# Patient Record
Sex: Female | Born: 1950
Health system: Southern US, Community
[De-identification: ages and names within clinical notes are randomized; demographics above are authoritative.]

## PROBLEM LIST (undated history)

## (undated) DIAGNOSIS — K589 Irritable bowel syndrome without diarrhea: Secondary | ICD-10-CM

## (undated) DIAGNOSIS — L509 Urticaria, unspecified: Secondary | ICD-10-CM

## (undated) DIAGNOSIS — M8580 Other specified disorders of bone density and structure, unspecified site: Secondary | ICD-10-CM

## (undated) DIAGNOSIS — F419 Anxiety disorder, unspecified: Secondary | ICD-10-CM

## (undated) DIAGNOSIS — I10 Essential (primary) hypertension: Secondary | ICD-10-CM

## (undated) DIAGNOSIS — T7840XA Allergy, unspecified, initial encounter: Secondary | ICD-10-CM

## (undated) DIAGNOSIS — R011 Cardiac murmur, unspecified: Secondary | ICD-10-CM

## (undated) DIAGNOSIS — H269 Unspecified cataract: Secondary | ICD-10-CM

## (undated) HISTORY — DX: Urticaria, unspecified: L50.9

## (undated) HISTORY — DX: Anxiety disorder, unspecified: F41.9

## (undated) HISTORY — DX: Essential (primary) hypertension: I10

## (undated) HISTORY — DX: Cardiac murmur, unspecified: R01.1

## (undated) HISTORY — PX: BREAST SURGERY: SHX581

## (undated) HISTORY — DX: Unspecified cataract: H26.9

## (undated) HISTORY — DX: Other specified disorders of bone density and structure, unspecified site: M85.80

## (undated) HISTORY — DX: Irritable bowel syndrome, unspecified: K58.9

## (undated) HISTORY — DX: Allergy, unspecified, initial encounter: T78.40XA

---

## 2008-04-07 ENCOUNTER — Emergency Department (HOSPITAL_BASED_OUTPATIENT_CLINIC_OR_DEPARTMENT_OTHER): Admission: EM | Admit: 2008-04-07 | Discharge: 2008-04-07 | Payer: Self-pay | Admitting: Emergency Medicine

## 2011-08-26 ENCOUNTER — Ambulatory Visit (INDEPENDENT_AMBULATORY_CARE_PROVIDER_SITE_OTHER): Payer: BC Managed Care – PPO | Admitting: Internal Medicine

## 2011-08-26 VITALS — BP 176/84 | HR 94 | Temp 98.1°F | Resp 16 | Ht 65.5 in | Wt 159.0 lb

## 2011-08-26 DIAGNOSIS — F419 Anxiety disorder, unspecified: Secondary | ICD-10-CM | POA: Insufficient documentation

## 2011-08-26 DIAGNOSIS — I10 Essential (primary) hypertension: Secondary | ICD-10-CM

## 2011-08-26 DIAGNOSIS — F411 Generalized anxiety disorder: Secondary | ICD-10-CM

## 2011-08-26 DIAGNOSIS — M545 Low back pain: Secondary | ICD-10-CM

## 2011-08-26 DIAGNOSIS — Z043 Encounter for examination and observation following other accident: Secondary | ICD-10-CM

## 2011-08-26 DIAGNOSIS — M549 Dorsalgia, unspecified: Secondary | ICD-10-CM

## 2011-08-26 DIAGNOSIS — M542 Cervicalgia: Secondary | ICD-10-CM

## 2011-08-26 DIAGNOSIS — R21 Rash and other nonspecific skin eruption: Secondary | ICD-10-CM

## 2011-08-26 MED ORDER — CLONAZEPAM 0.5 MG PO TABS: 0.5000 mg | ORAL_TABLET | Freq: Two times a day (BID) | ORAL | Status: DC | PRN

## 2011-08-26 MED ORDER — NEBIVOLOL HCL 5 MG PO TABS: 5.0000 mg | ORAL_TABLET | Freq: Every day | ORAL | Status: DC
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
## 2011-10-27 ENCOUNTER — Ambulatory Visit (INDEPENDENT_AMBULATORY_CARE_PROVIDER_SITE_OTHER): Payer: BC Managed Care – PPO | Admitting: Family Medicine

## 2011-10-27 VITALS — BP 187/98 | HR 91 | Temp 97.9°F | Resp 18 | Wt 153.0 lb

## 2011-10-27 DIAGNOSIS — R42 Dizziness and giddiness: Secondary | ICD-10-CM

## 2011-10-27 DIAGNOSIS — I10 Essential (primary) hypertension: Secondary | ICD-10-CM

## 2011-10-27 MED ORDER — NEBIVOLOL HCL 5 MG PO TABS: ORAL_TABLET | ORAL | Status: DC

## 2011-10-27 MED ORDER — MECLIZINE HCL 50 MG PO TABS: 25.0000 mg | ORAL_TABLET | Freq: Three times a day (TID) | ORAL | Status: AC | PRN

## 2011-10-27 NOTE — Patient Instructions (Signed)
Vertigo Vertigo means you feel like you or your surroundings are moving when they are not. Vertigo can be dangerous if it occurs when you are at work, driving, or performing difficult activities.  CAUSES  Vertigo occurs when there is a conflict of signals sent to your brain from the visual and sensory systems in your body. There are many different causes of vertigo, including:  Infections, especially in the inner ear.   A bad reaction to a drug or misuse of alcohol and medicines.   Withdrawal from drugs or alcohol.   Rapidly changing positions, such as lying down or rolling over in bed.   A migraine headache.   Decreased blood flow to the brain.   Increased pressure in the brain from a head injury, infection, tumor, or bleeding.  SYMPTOMS  You may feel as though the world is spinning around or you are falling to the ground. Because your balance is upset, vertigo can cause nausea and vomiting. You may have involuntary eye movements (nystagmus). DIAGNOSIS  Vertigo is usually diagnosed by physical exam. If the cause of your vertigo is unknown, your caregiver may perform imaging tests, such as an MRI scan (magnetic resonance imaging). TREATMENT  Most cases of vertigo resolve on their own, without treatment. Depending on the cause, your caregiver may prescribe certain medicines. If your vertigo is related to body position issues, your caregiver may recommend movements or procedures to correct the problem. In rare cases, if your vertigo is caused by certain inner ear problems, you may need surgery. HOME CARE INSTRUCTIONS   Follow your caregiver's instructions.   Avoid driving.   Avoid operating heavy machinery.   Avoid performing any tasks that would be dangerous to you or others during a vertigo episode.   Tell your caregiver if you notice that certain medicines seem to be causing your vertigo. Some of the medicines used to treat vertigo episodes can actually make them worse in some  people.  SEEK IMMEDIATE MEDICAL CARE IF:   Your medicines do not relieve your vertigo or are making it worse.   You develop problems with talking, walking, weakness, or using your arms, hands, or legs.   You develop severe headaches.   Your nausea or vomiting continues or gets worse.   You develop visual changes.   A family member notices behavioral changes.   Your condition gets worse.  MAKE SURE YOU:  Understand these instructions.   Will watch your condition.   Will get help right away if you are not doing well or get worse.  Document Released: 04/13/2005 Document Revised: 06/23/2011 Document Reviewed: 01/20/2011 [employer] Patient Information 2012 [employer]. 

## 2011-10-27 NOTE — Progress Notes (Signed)
Subjective: 61 year old lady who has had a little stuffiness the past few days, but not been ill. Has been having it little bit of dizziness off and on, but nothing major. This morning when she awakened she had extreme dizziness. She didn't feel like she can get up she was so dizzy. She had to call for assistance. He was nauseous but she did not vomit. The dizziness was very intense than his paper to a little bit disappointed today. She still feels poorly arousable. Her blood pressure was little bit elevated at home, having been asked excellent yesterday. They checked her blood sugar which was good today. She has been a little bit Borderline on that. She has not had major episodes of  dizziness like this in the past. No headache. No fever.  Objective: Alert oriented lady who looks like she does not feel well. TMs are normal. Eyes PERRLA. Fundi benign. No nystatin is. Throat clear. Neck supple without nodes or thyromegaly. No carotid bruits. Chest clear to auscultation. Heart regular without murmurs. No arrhythmias were noted. Neck was supple.  Assessment: Dizziness, nonspecific vertigo. Hypertension(180/106 when I checked it)  Plan: Antivert Increase blood pressure medication to 10 mg daily. Go home and rest. Do not drive. It is getting worse she needs to return for further assessment of go to the emergency room after hours.

## 2011-10-28 ENCOUNTER — Telehealth: Payer: Self-pay

## 2011-10-28 NOTE — Telephone Encounter (Signed)
Pt is calling stating that pt is in a lot of pain and has a lot of congestion and has taken the medication dr Quintella Reichert has precribed and it is not working she feels worse   Please call her at [phone]  Pt states she is not coming back in to pay again

## 2011-10-28 NOTE — Telephone Encounter (Signed)
Pts husband is calling again in reference to this pt he states that he believes that she has a sinus infection because she used a netti pot and felt better, also he states that pt is still having problems with vertigo. Please Advise! [phone]

## 2011-10-28 NOTE — Telephone Encounter (Signed)
Pt is still experiencing dizziness and would like to speak with a nurse or Dr Merla Riches, pt was seen in office 10-27-11 please contact pt has some concern her husband contacted [facility]. [phone]

## 2011-10-29 NOTE — Telephone Encounter (Signed)
.  [facility] Patient's husband called again in reference to patient still having congestion and sinus problems.  Patient would like return call from MD regarding this.  Please call patient at [phone].

## 2011-10-30 ENCOUNTER — Ambulatory Visit (INDEPENDENT_AMBULATORY_CARE_PROVIDER_SITE_OTHER): Payer: BC Managed Care – PPO | Admitting: Internal Medicine

## 2011-10-30 VITALS — BP 178/80 | HR 102 | Temp 98.4°F | Resp 16 | Ht 65.5 in | Wt 152.0 lb

## 2011-10-30 DIAGNOSIS — R42 Dizziness and giddiness: Secondary | ICD-10-CM

## 2011-10-30 DIAGNOSIS — J019 Acute sinusitis, unspecified: Secondary | ICD-10-CM

## 2011-10-30 DIAGNOSIS — J309 Allergic rhinitis, unspecified: Secondary | ICD-10-CM

## 2011-10-30 MED ORDER — PREDNISONE 20 MG PO TABS: ORAL_TABLET | ORAL | Status: DC

## 2011-10-30 MED ORDER — CETIRIZINE HCL 10 MG PO TABS: 10.0000 mg | ORAL_TABLET | Freq: Every day | ORAL | Status: DC

## 2011-10-30 MED ORDER — FLUTICASONE PROPIONATE 50 MCG/ACT NA SUSP: 2.0000 | Freq: Every day | NASAL | Status: DC

## 2011-10-30 MED ORDER — AMOXICILLIN 500 MG PO CAPS: 1000.0000 mg | ORAL_CAPSULE | Freq: Two times a day (BID) | ORAL | Status: AC

## 2011-10-30 NOTE — Progress Notes (Signed)
  Subjective:    Patient ID: Amanda Swanson, female    DOB: April 11, 1951, 61 y.o.   MRN: [id number]  HPIsee Recent evaluation for dizziness Continues to feel postural dizziness especially sitting to standing Continues with marked nasal congestion and sinus pressure with frontal headaches Has some purulent discharge in the morning History of spring allergies in the past No cough No fever No syncope History of hypertension control with bystolic/3 years ago she had an difficult time controlling his blood pressure after steroid injection in her shoulder and went through several medications/her current blood pressure readings at home are normal/she often has abnormal readings in a physician's office     Review of SystemsNo weight loss or night sweats Anxiety stable on medications when necessary No palpitations or chest pain/no dyspnea on exertion/no edema     Objective:   Physical ExamVital signs stable except blood pressure 178/80 Obviously uncomfortable with changing positions quickly Pupils equal round and reactive to light/EOMs conjugate/no Nystagmus TMs clear Nares boggy with purulent discharge Bilateral maxillary sinus tenderness to percussion Throat clear without a.c. Nodes Lungs clear Heart regular without murmurs rubs and gallops Neurological: Cranial nerves II through XII intact Finger to nose intact/Romberg negative/5 motor intact Gait intact       Assessment & Plan:  Problem #1 vertigo/dizziness probably secondary to #2 Problem #2 sinusitis secondary #3 Problem #3 seasonal allergic rhinitis  Plan prednisone/amoxicillin /Zyrtec/and Flonase Followup in one week if not well Continue Flonase for 3 months CPE scheduled for May

## 2011-10-31 NOTE — Telephone Encounter (Signed)
LMOM to CB. 

## 2011-11-05 NOTE — Telephone Encounter (Signed)
Patient was re-evaluated for this problem on 10/30/2011 by Dr. Merla Riches.  Issue addressed.

## 2011-11-23 ENCOUNTER — Encounter: Payer: BC Managed Care – PPO | Admitting: Internal Medicine

## 2011-11-30 ENCOUNTER — Encounter: Payer: BC Managed Care – PPO | Admitting: Internal Medicine

## 2012-01-11 ENCOUNTER — Encounter: Payer: Self-pay | Admitting: Internal Medicine

## 2012-01-11 ENCOUNTER — Ambulatory Visit (INDEPENDENT_AMBULATORY_CARE_PROVIDER_SITE_OTHER): Payer: BC Managed Care – PPO | Admitting: Internal Medicine

## 2012-01-11 VITALS — BP 177/100 | HR 93 | Temp 98.3°F | Resp 17 | Ht 66.5 in | Wt 153.0 lb

## 2012-01-11 DIAGNOSIS — Z0000 Encounter for general adult medical examination without abnormal findings: Secondary | ICD-10-CM

## 2012-01-11 DIAGNOSIS — I10 Essential (primary) hypertension: Secondary | ICD-10-CM

## 2012-01-11 DIAGNOSIS — H1045 Other chronic allergic conjunctivitis: Secondary | ICD-10-CM

## 2012-01-11 DIAGNOSIS — H1010 Acute atopic conjunctivitis, unspecified eye: Secondary | ICD-10-CM

## 2012-01-11 DIAGNOSIS — R5381 Other malaise: Secondary | ICD-10-CM

## 2012-01-11 DIAGNOSIS — G4700 Insomnia, unspecified: Secondary | ICD-10-CM

## 2012-01-11 DIAGNOSIS — R5383 Other fatigue: Secondary | ICD-10-CM

## 2012-01-11 LAB — LIPID PANEL
HDL: 63 mg/dL (ref >39)
LDL Cholesterol: 127 mg/dL — ABNORMAL HIGH (ref 0–99)
Total CHOL/HDL Ratio: 3.3 Ratio
Triglycerides: 106 mg/dL (ref <150)
VLDL: 21 mg/dL (ref 0–40)

## 2012-01-11 LAB — COMPREHENSIVE METABOLIC PANEL
AST: 18 U/L (ref 0–37)
Alkaline Phosphatase: 77 U/L (ref 39–117)
BUN: 13 mg/dL (ref 6–23)
Creat: 0.78 mg/dL (ref 0.50–1.10)
Glucose, Bld: 108 mg/dL — ABNORMAL HIGH (ref 70–99)
Potassium: 3.9 mEq/L (ref 3.5–5.3)
Total Bilirubin: 0.6 mg/dL (ref 0.3–1.2)

## 2012-01-11 LAB — CBC WITH DIFFERENTIAL/PLATELET
Basophils Absolute: 0 10*3/uL (ref 0.0–0.1)
Basophils Relative: 0 % (ref 0–1)
Eosinophils Absolute: 0.1 10*3/uL (ref 0.0–0.7)
Eosinophils Relative: 1 % (ref 0–5)
HCT: 42.8 % (ref 36.0–46.0)
MCH: 27.9 pg (ref 26.0–34.0)
MCHC: 34.8 g/dL (ref 30.0–36.0)
Monocytes Absolute: 0.5 10*3/uL (ref 0.1–1.0)
Neutro Abs: 5.5 10*3/uL (ref 1.7–7.7)
RDW: 14.5 % (ref 11.5–15.5)

## 2012-01-11 MED ORDER — NEBIVOLOL HCL 10 MG PO TABS: 10.0000 mg | ORAL_TABLET | Freq: Every day | ORAL | Status: DC

## 2012-01-11 MED ORDER — OLOPATADINE HCL 0.2 % OP SOLN: 1.0000 [drp] | Freq: Every day | OPHTHALMIC | Status: DC

## 2012-01-11 MED ORDER — NEBIVOLOL HCL 5 MG PO TABS: 10.0000 mg | ORAL_TABLET | Freq: Every day | ORAL | Status: DC

## 2012-01-11 NOTE — Progress Notes (Signed)
  Subjective:    Patient ID: Amanda Swanson, female    DOB: 1951/03/18, 61 y.o.   MRN: [id number]  HPIannual exam Patient Active Problem List  Diagnosis  . Anxiety-centered around driving/riding//used to commute long way tot each///just retired/to [employer] for ann[employer] this wekend--?better  . HTN (hypertension)-bysto 5    Past medical history, surgical history, family history, social history, and preventive history intact and scanned//Important for history of breast cancer in one sister and ovarian cancer and another. Also diabetes in sister and brother. Recent Pap and mammogram normal/colonoscopy 2 months ago within normal limits   Review of Systems14 system scanned/positive for early morning eye redness with itching for 3 months, intermittent insomnia for years due to worrying and partner snoring, recent fatigue without weight loss or weight gain and without activity change or night sweats.     Objective:   Physical Exam Vital signs blood pressure 177/100 Mild overweight Skin clear HEENT intact except bilateral mild conjunctival injection Neck supple without thyromegaly or lymphadenopathy Lungs clear Heart regular without murmurs rubs or gallops Abdomen nontender with no organomegaly or masses Major joints with full range of motion and no swelling Neurological intact Psychiatric intact       Assessment & Plan:   1. Annual physical exam    2. HTN (hypertension) -Increased medication Increase exercise/Continue home monitoring nebivolol (BYSTOLIC) 10 MG tablet, CBC with Differential, Comprehensive metabolic panel, Lipid panel, TSH, Vitamin D 25 hydroxy, DISCONTINUED: nebivolol (BYSTOLIC) 5 MG tablet  3. Allergic conjunctivitis -add drops once a day Olopatadine HCl 0.2 % SOLN,  4. Insomnia  Occasional Klonopin/she will call if she needs another prescription/she takes this rarely  5. Fatigue  Will notify of plan after labs

## 2012-01-12 ENCOUNTER — Encounter: Payer: Self-pay | Admitting: Internal Medicine

## 2012-01-12 LAB — TSH: TSH: 1.212 u[IU]/mL (ref 0.350–4.500)

## 2012-02-10 ENCOUNTER — Ambulatory Visit (INDEPENDENT_AMBULATORY_CARE_PROVIDER_SITE_OTHER): Payer: BC Managed Care – PPO | Admitting: Internal Medicine

## 2012-02-10 ENCOUNTER — Telehealth: Payer: Self-pay

## 2012-02-10 VITALS — BP 152/92 | HR 87 | Temp 97.7°F | Resp 17 | Ht 67.0 in | Wt 154.0 lb

## 2012-02-10 DIAGNOSIS — J329 Chronic sinusitis, unspecified: Secondary | ICD-10-CM

## 2012-02-10 DIAGNOSIS — J309 Allergic rhinitis, unspecified: Secondary | ICD-10-CM

## 2012-02-10 DIAGNOSIS — H1010 Acute atopic conjunctivitis, unspecified eye: Secondary | ICD-10-CM

## 2012-02-10 DIAGNOSIS — M26629 Arthralgia of temporomandibular joint, unspecified side: Secondary | ICD-10-CM

## 2012-02-10 DIAGNOSIS — E785 Hyperlipidemia, unspecified: Secondary | ICD-10-CM | POA: Insufficient documentation

## 2012-02-10 DIAGNOSIS — I10 Essential (primary) hypertension: Secondary | ICD-10-CM

## 2012-02-10 DIAGNOSIS — R42 Dizziness and giddiness: Secondary | ICD-10-CM

## 2012-02-10 MED ORDER — CYCLOBENZAPRINE HCL 10 MG PO TABS: 10.0000 mg | ORAL_TABLET | Freq: Every day | ORAL | Status: AC

## 2012-02-10 MED ORDER — AMOXICILLIN 500 MG PO CAPS: 1000.0000 mg | ORAL_CAPSULE | Freq: Two times a day (BID) | ORAL | Status: AC

## 2012-02-10 MED ORDER — HYDROCHLOROTHIAZIDE 12.5 MG PO CAPS: 12.5000 mg | ORAL_CAPSULE | Freq: Every day | ORAL | Status: DC
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
## 2012-02-10 NOTE — Telephone Encounter (Signed)
Pt is having problems with sinus and allergy and is hoping someone can prescribe something for her over the phone.  Best # [phone]  [id number] [place], [place] - [address]

## 2012-02-11 NOTE — Telephone Encounter (Signed)
Pt seen yesterday for problem

## 2012-02-25 ENCOUNTER — Ambulatory Visit (INDEPENDENT_AMBULATORY_CARE_PROVIDER_SITE_OTHER): Payer: BC Managed Care – PPO | Admitting: Family Medicine

## 2012-02-25 VITALS — BP 159/79 | HR 101 | Temp 98.1°F | Resp 18 | Ht 65.5 in | Wt 155.8 lb

## 2012-02-25 DIAGNOSIS — N750 Cyst of Bartholin's gland: Secondary | ICD-10-CM

## 2012-02-25 DIAGNOSIS — N760 Acute vaginitis: Secondary | ICD-10-CM

## 2012-02-25 DIAGNOSIS — N751 Abscess of Bartholin's gland: Secondary | ICD-10-CM

## 2012-02-25 DIAGNOSIS — N898 Other specified noninflammatory disorders of vagina: Secondary | ICD-10-CM

## 2012-02-25 LAB — POCT WET PREP WITH KOH
Clue Cells Wet Prep HPF POC: NEGATIVE
KOH Prep POC: NEGATIVE
RBC Wet Prep HPF POC: NEGATIVE
Trichomonas, UA: NEGATIVE
Yeast Wet Prep HPF POC: NEGATIVE

## 2012-02-25 MED ORDER — HYDROCODONE-ACETAMINOPHEN 5-500 MG PO TABS: 1.0000 | ORAL_TABLET | Freq: Three times a day (TID) | ORAL | Status: AC | PRN

## 2012-02-25 MED ORDER — METRONIDAZOLE 250 MG PO TABS: 250.0000 mg | ORAL_TABLET | Freq: Three times a day (TID) | ORAL | Status: AC

## 2012-02-25 MED ORDER — CLINDAMYCIN HCL 150 MG PO CAPS: 150.0000 mg | ORAL_CAPSULE | Freq: Three times a day (TID) | ORAL | Status: AC

## 2012-02-25 MED ORDER — FLUCONAZOLE 150 MG PO TABS: 150.0000 mg | ORAL_TABLET | Freq: Once | ORAL | Status: AC

## 2012-02-25 NOTE — Addendum Note (Signed)
Addended by: Elvina Sidle on: 02/25/2012 11:28 AM   Modules accepted: Orders

## 2012-02-25 NOTE — Progress Notes (Addendum)
61 year old Hispanic([place]) woman is here today with complaints of Vaginal itch and cyst. Pt states that she was prescribed an antibiotic for a sinus infection and feels that's where the yeast infection is coming from. Pt also states that she a bump in her vagina area. Pt states she has a history of cyst that has to be lanced. Pt states she has no other complaints.  Objective: Mild distress when sitting  Pelvic exam reveals a 3-4 cm right Bartholin's abscess which is tender and fluctuant  After informed consent, the abscess was anesthetized with 1% Xylocaine with epinephrine An 18-gauge needle was inserted into the abscess and 20 cc of yellow pus was aspirated. Following this the cyst was massaged and another 20 cc was expressed. 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 Abscess of Bartholin's gland  clindamycin (CLEOCIN) 150 MG capsule, metroNIDAZOLE (FLAGYL) 250 MG tablet, fluconazole (DIFLUCAN) 150 MG tablet, HYDROcodone-acetaminophen (VICODIN) 5-500 MG per tablet  2. Vaginal itching  POCT Wet Prep with KOH   Patient told to do sitz baths and follow up gyn referral will be made to Dr Juliene Pina

## 2012-02-25 NOTE — Addendum Note (Signed)
Addended by: Bronson Curb on: 02/25/2012 11:32 AM   Modules accepted: Orders

## 2012-02-29 LAB — WOUND CULTURE
Gram Stain: NONE SEEN
Gram Stain: NONE SEEN

## 2012-03-01 ENCOUNTER — Telehealth: Payer: Self-pay

## 2012-03-01 NOTE — Telephone Encounter (Signed)
Faxed over records to [facility] per request below.

## 2012-03-01 NOTE — Telephone Encounter (Signed)
[facility] is sending authorization for request of records from 02/25/12.  Please fax to [phone] once release of information form received from fax.

## 2012-03-02 ENCOUNTER — Telehealth: Payer: Self-pay

## 2012-03-02 MED ORDER — CIPROFLOXACIN HCL 500 MG PO TABS: 500.0000 mg | ORAL_TABLET | Freq: Two times a day (BID) | ORAL | Status: AC

## 2012-03-02 NOTE — Telephone Encounter (Signed)
Pt had culture done and wants results, she is taking Clindamycin 150 mg and Flagyl 250 mg

## 2012-03-02 NOTE — Telephone Encounter (Signed)
Growing Citrobacter. Please change to Cipro.

## 2012-03-02 NOTE — Telephone Encounter (Signed)
I have advised patient 

## 2012-10-01 ENCOUNTER — Telehealth: Payer: Self-pay

## 2012-10-01 DIAGNOSIS — J309 Allergic rhinitis, unspecified: Secondary | ICD-10-CM

## 2012-10-01 NOTE — Telephone Encounter (Signed)
Pt would like referral to allergist if possible [phone]

## 2012-10-01 NOTE — Telephone Encounter (Signed)
Is it okay to refer?

## 2012-10-01 NOTE — Telephone Encounter (Signed)
I put in a referral to allergy

## 2012-10-02 NOTE — Telephone Encounter (Signed)
Advised patient of referral made/ Amy

## 2013-04-03 ENCOUNTER — Encounter: Payer: Self-pay | Admitting: Internal Medicine

## 2013-04-03 ENCOUNTER — Ambulatory Visit (INDEPENDENT_AMBULATORY_CARE_PROVIDER_SITE_OTHER): Payer: BC Managed Care – PPO | Admitting: Internal Medicine

## 2013-04-03 VITALS — BP 140/86 | HR 83 | Temp 98.6°F | Resp 16 | Ht 66.0 in | Wt 158.0 lb

## 2013-04-03 DIAGNOSIS — F411 Generalized anxiety disorder: Secondary | ICD-10-CM

## 2013-04-03 DIAGNOSIS — F419 Anxiety disorder, unspecified: Secondary | ICD-10-CM

## 2013-04-03 DIAGNOSIS — I10 Essential (primary) hypertension: Secondary | ICD-10-CM

## 2013-04-03 DIAGNOSIS — M7581 Other shoulder lesions, right shoulder: Secondary | ICD-10-CM

## 2013-04-03 DIAGNOSIS — R002 Palpitations: Secondary | ICD-10-CM

## 2013-04-03 DIAGNOSIS — Z0000 Encounter for general adult medical examination without abnormal findings: Secondary | ICD-10-CM

## 2013-04-03 DIAGNOSIS — M67919 Unspecified disorder of synovium and tendon, unspecified shoulder: Secondary | ICD-10-CM

## 2013-04-03 LAB — CBC WITH DIFFERENTIAL/PLATELET
Basophils Absolute: 0 10*3/uL (ref 0.0–0.1)
Eosinophils Relative: 2 % (ref 0–5)
HCT: 42.3 % (ref 36.0–46.0)
Hemoglobin: 14.4 g/dL (ref 12.0–15.0)
Lymphocytes Relative: 24 % (ref 12–46)
MCHC: 34 g/dL (ref 30.0–36.0)
MCV: 80.6 fL (ref 78.0–100.0)
Monocytes Absolute: 0.7 10*3/uL (ref 0.1–1.0)
Monocytes Relative: 9 % (ref 3–12)
Neutro Abs: 5 10*3/uL (ref 1.7–7.7)
RDW: 15.2 % (ref 11.5–15.5)
WBC: 7.7 10*3/uL (ref 4.0–10.5)

## 2013-04-03 MED ORDER — NEBIVOLOL HCL 10 MG PO TABS: 5.0000 mg | ORAL_TABLET | Freq: Every day | ORAL | Status: DC

## 2013-04-03 MED ORDER — CLONAZEPAM 0.5 MG PO TABS: 0.5000 mg | ORAL_TABLET | Freq: Two times a day (BID) | ORAL | Status: DC | PRN

## 2013-04-03 NOTE — Progress Notes (Signed)
  Subjective:    Patient ID: Amanda Swanson, female    DOB: 1950-08-14, 62 y.o.   MRN: [id number]  HPI    Review of Systems  Constitutional: Negative.   HENT: Positive for neck stiffness and sinus pressure.   Eyes: Positive for itching.  Respiratory: Negative.   Cardiovascular: Negative.   Gastrointestinal: Negative.   Endocrine: Negative.   Genitourinary: Negative.   Skin: Negative.   Allergic/Immunologic: Negative.   Neurological: Negative.   Hematological: Negative.   Psychiatric/Behavioral: Positive for sleep disturbance.       Objective:   Physical Exam        Assessment & Plan:

## 2013-04-03 NOTE — Progress Notes (Signed)
Subjective:    Patient ID: Amanda Swanson, female    DOB: 1951/06/27, 62 y.o.   MRN: [id number]  HPIannual Patient Active Problem List   Diagnosis Date Noted  . Palpitations 04/03/2013  . hyperlipidemia 02/10/2012  . Anxiety 08/26/2011  . HTN (hypertension) 08/26/2011  Current outpatient prescriptions:clonazePAM (KLONOPIN) 0.5 MG tablet, Take 1 tablet (0.5 mg total) by mouth 2 (two) times daily as needed for anxiety. Takes 1/2 pill prnrarely  Multiple Vitamin (MULTIVITAMIN) tablet, Take by mouth daily. Takes 1/2 daily cetirizine (ZYRTEC) 10 MG tablet,  fluticasone (FLONASE) 50 MCG/ACT nasal spray, Place 2 sprays into the nose daily.,  Recent allerg eval=allergic to everything/antihist not effective hydrochlorothiazide (MICROZIDE) 12.5 MG capsule, Take 1 capsule (12.5 mg total) by mouth daily-she choses to use seldom/prn with home BP nebivolol (BYSTOLIC) 10 MG tablet, Take 0.5 tablets (5 mg total) by mouth daily.long term for bp and contr of palpitat Olopatadine HCl 0.2 % SOLN, Apply 1 drop to eye daily., Disp: 1 Bottle, Rfl: 11   BP variable palpit hx--bystolic for both//has diuretic-rarely uses Retired st empl--lots of volunteer work Married happy  Review of Systems See [employer] Also c/o pain R shoulder 3 mos-after lifting Also c/o bloating aft meals freq w/out pain or diarrhea    Her sleep issues involve waking to pee and not being able to resume sleep Objective:   Physical Exam  Constitutional: She is oriented to person, place, and time. She appears well-developed and well-nourished.  HENT:  Head: Normocephalic.  Right Ear: External ear normal.  Left Ear: External ear normal.  Nose: Nose normal.  Mouth/Throat: Oropharynx is clear and moist. No oropharyngeal exudate.  Eyes: Conjunctivae and EOM are normal. Pupils are equal, round, and reactive to light. Left eye exhibits no discharge.  Neck: Normal range of motion. Neck supple. No thyromegaly present.  Neck rom sl  decr by stiffness but no rad sxt  Cardiovascular: Normal rate, regular rhythm, normal heart sounds and intact distal pulses.   No murmur heard. Pulmonary/Chest: Effort normal and breath sounds normal.  Abdominal: Soft. Bowel sounds are normal. She exhibits no mass. There is no tenderness.  Musculoskeletal: She exhibits no edema.  Right shoulder has pain with abduction against resistance and abduction above 45 Tender over the shoulder generally but not the trapezius or scapular border This does not seem to be radicular from the neck  Lymphadenopathy:    She has no cervical adenopathy.  Neurological: She is alert and oriented to person, place, and time. She has normal reflexes. No cranial nerve deficit. Coordination normal.  Skin: Skin is warm and dry. No rash noted.  Psychiatric: She has a normal mood and affect. Her behavior is normal. Judgment and thought content normal.   BP 140/86  Pulse 83  Temp(Src) 98.6 F (37 C) (Oral)  Resp 16  Ht 5\' 6"  (1.676 m)  Wt 158 lb (71.668 kg)  BMI 25.51 kg/m2  SpO2 98%      Assessment & Plan:  Anxiety - Plan: clonazePAM (KLONOPIN) 0.5 MG tablet  HTN (hypertension) - Plan: nebivolol (BYSTOLIC) 10 MG tablet  Palpitations  Annual physical exam - Plan: CBC with Differential, Comprehensive metabolic panel, Lipid panel, POCT glycosylated hemoglobin (Hb A1C)  Rotator cuff injury--she refuses physical therapy and referral at this point/gave handout for home exercises to begin twice a day   Meds ordered this encounter  Medications  . clonazePAM (KLONOPIN) 0.5 MG tablet    Sig: Take 1 tablet (0.5 mg total) by  mouth 2 (two) times daily as needed for anxiety. Takes 1/2 pill prn- uses this seldomly     Dispense:  60 tablet    Refill:  0  . nebivolol (BYSTOLIC) 10 MG tablet    Sig: Take 0.5 tablets (5 mg total) by mouth daily.    Dispense:  90 tablet    Refill:  1  Follow home blood pressures /add Microzide systolic greater than 135

## 2013-04-03 NOTE — Patient Instructions (Addendum)

## 2013-04-04 ENCOUNTER — Encounter: Payer: Self-pay | Admitting: Internal Medicine

## 2013-04-04 LAB — COMPREHENSIVE METABOLIC PANEL
AST: 17 U/L (ref 0–37)
Albumin: 4.5 g/dL (ref 3.5–5.2)
BUN: 12 mg/dL (ref 6–23)
Calcium: 10 mg/dL (ref 8.4–10.5)
Chloride: 102 mEq/L (ref 96–112)
Creat: 0.72 mg/dL (ref 0.50–1.10)
Glucose, Bld: 94 mg/dL (ref 70–99)
Potassium: 4.1 mEq/L (ref 3.5–5.3)

## 2013-04-04 LAB — LIPID PANEL
Cholesterol: 232 mg/dL — ABNORMAL HIGH (ref 0–200)
HDL: 53 mg/dL (ref >39)
Total CHOL/HDL Ratio: 4.4 Ratio
Triglycerides: 147 mg/dL (ref <150)

## 2013-06-19 ENCOUNTER — Other Ambulatory Visit: Payer: Self-pay | Admitting: Internal Medicine

## 2013-06-21 ENCOUNTER — Telehealth: Payer: Self-pay

## 2013-06-21 DIAGNOSIS — I10 Essential (primary) hypertension: Secondary | ICD-10-CM

## 2013-06-21 MED ORDER — NEBIVOLOL HCL 10 MG PO TABS: 10.0000 mg | ORAL_TABLET | Freq: Every day | ORAL | Status: DC

## 2013-06-21 NOTE — Telephone Encounter (Signed)
Patient was given a 45 day supply of nebivolol (BYSTOLIC) 10 MG tablet, she refused these at the pharmacy. Patient needs it for 90 days.  [phone]

## 2013-06-21 NOTE — Telephone Encounter (Signed)
Dr. Merla Riches,  Patient requests a prescription written for the 10 mg tablet QD.  She breaks them in half, so #90 lasts 180 days.  Please advise.

## 2013-06-24 NOTE — Telephone Encounter (Signed)
She should discuss with the pharmacy, #90 was sent in. She states the pharmacy did correct this.

## 2013-07-08 ENCOUNTER — Ambulatory Visit: Payer: Self-pay

## 2013-07-08 ENCOUNTER — Ambulatory Visit (INDEPENDENT_AMBULATORY_CARE_PROVIDER_SITE_OTHER): Payer: BC Managed Care – PPO | Admitting: Emergency Medicine

## 2013-07-08 VITALS — BP 148/90 | HR 83 | Temp 98.0°F | Resp 16 | Ht 66.0 in | Wt 162.0 lb

## 2013-07-08 DIAGNOSIS — R209 Unspecified disturbances of skin sensation: Secondary | ICD-10-CM

## 2013-07-08 DIAGNOSIS — M25519 Pain in unspecified shoulder: Secondary | ICD-10-CM

## 2013-07-08 DIAGNOSIS — M542 Cervicalgia: Secondary | ICD-10-CM

## 2013-07-08 DIAGNOSIS — R200 Anesthesia of skin: Secondary | ICD-10-CM

## 2013-07-08 DIAGNOSIS — M25511 Pain in right shoulder: Secondary | ICD-10-CM

## 2013-07-08 MED ORDER — MELOXICAM 7.5 MG PO TABS: ORAL_TABLET | ORAL | Status: DC

## 2013-07-08 NOTE — Progress Notes (Addendum)
Subjective:    Patient ID: Amanda Swanson, female    DOB: [birth year], 63 y.o.   MRN: [id number]  HPI This chart was scribed for Amanda Chris, MD by [name], ED Scribe. This patient was seen in room Room 8 and the patient's care was started at 10:37 AM.  HPI Comments: Amanda Swanson is a 62 y.o. female who presents to the [facility] complaining of constant R upper shoulder pain that began in July after exercising in the pool. She reports associated paresthesia in her R hand and fingers and states the pain radiates to the right side of her neck; however, she is not sure if her neck pain is due to a separate incident. She denies any pain radiating down her forearm. She has not taken Tylenol or Aleve for her pain.   Past Medical History  Diagnosis Date   Allergy    Cataract    Heart murmur    Hypertension     Past Surgical History  Procedure Laterality Date   Breast surgery      Family History  Problem Relation Age of Onset   Heart disease Father    Cancer Sister     breast   Diabetes Brother     History   Social History   Marital Status: Divorced    Spouse Name: N/A    Number of Children: N/A   Years of Education: N/A   Occupational History   Not on file.   Social History Main Topics   Smoking status: Never Smoker    Smokeless tobacco: Not on file   Alcohol Use: No   Drug Use: No   Sexual Activity: Yes   Other Topics Concern   Not on file   Social History Narrative   4 pregnancies   3 live births   1 miscarriage    No Known Allergies  Patient Active Problem List   Diagnosis Date Noted   Palpitations 04/03/2013   hyperlipidemia 02/10/2012   Anxiety 08/26/2011   HTN (hypertension) 08/26/2011    Results for orders placed in visit on 04/03/13  CBC WITH DIFFERENTIAL      Result Value Range   WBC 7.7  4.0 - 10.5 K/uL   RBC 5.25 (*) 3.87 - 5.11 MIL/uL   Hemoglobin 14.4  12.0 - 15.0 g/dL   HCT  40.9  81.1 - 91.4 %   MCV 80.6  78.0 - 100.0 fL   MCH 27.4  26.0 - 34.0 pg   MCHC 34.0  30.0 - 36.0 g/dL   RDW 78.2  95.6 - 21.3 %   Platelets 228  150 - 400 K/uL   Neutrophils Relative % 65  43 - 77 %   Neutro Abs 5.0  1.7 - 7.7 K/uL   Lymphocytes Relative 24  12 - 46 %   Lymphs Abs 1.8  0.7 - 4.0 K/uL   Monocytes Relative 9  3 - 12 %   Monocytes Absolute 0.7  0.1 - 1.0 K/uL   Eosinophils Relative 2  0 - 5 %   Eosinophils Absolute 0.2  0.0 - 0.7 K/uL   Basophils Relative 0  0 - 1 %   Basophils Absolute 0.0  0.0 - 0.1 K/uL   Smear Review Criteria for review not met    COMPREHENSIVE METABOLIC PANEL      Result Value Range   Sodium 141  135 - 145 mEq/L   Potassium 4.1  3.5 - 5.3  mEq/L   Chloride 102  96 - 112 mEq/L   CO2 27  19 - 32 mEq/L   Glucose, Bld 94  70 - 99 mg/dL   BUN 12  6 - 23 mg/dL   Creat 7.82  9.56 - 2.13 mg/dL   Total Bilirubin 0.8  0.3 - 1.2 mg/dL   Alkaline Phosphatase 81  39 - 117 U/L   AST 17  0 - 37 U/L   ALT 17  0 - 35 U/L   Total Protein 7.5  6.0 - 8.3 g/dL   Albumin 4.5  3.5 - 5.2 g/dL   Calcium 08.6  8.4 - 57.8 mg/dL  LIPID PANEL      Result Value Range   Cholesterol 232 (*) 0 - 200 mg/dL   Triglycerides 469  <629 mg/dL   HDL 53  >52 mg/dL   Total CHOL/HDL Ratio 4.4     VLDL 29  0 - 40 mg/dL   LDL Cholesterol 841 (*) 0 - 99 mg/dL  POCT GLYCOSYLATED HEMOGLOBIN (HGB A1C)      Result Value Range   Hemoglobin A1C 5.8      No diagnosis found.  No orders of the defined types were placed in this encounter.    Review of Systems  Constitutional: Negative for fever and chills.  Musculoskeletal: Positive for neck pain.       R shoulder pain. Paresthesias in R hand.   A complete 10 system review of systems was obtained and all systems are negative except as noted in the HPI and PMH.     Objective:   Physical Exam  Nursing note and vitals reviewed. Constitutional: Patient is oriented to person, place, and time. Patient appears well-developed and  well-nourished. No distress.  HENT:  Head: Normocephalic and atraumatic.  Neck: Neck supple. No tracheal deviation present.  Cardiovascular: Normal rate, regular rhythm and normal heart sounds.   No murmur heard. Pulmonary/Chest: Effort normal and breath sounds normal. No respiratory distress. Patient has no wheezes. Patient has no rales.  Musculoskeletal: R shoulder deltoid tenderness; limited ROM. R Shoulder pain with external and internal rotation. R shoulder pain with abduction. Neurological: Patient is alert and oriented to person, place, and time.  Skin: Skin is warm and dry.  Psychiatric: Patient has a normal mood and affect. Patient's behavior is normal.   BP 148/90   Pulse 83   Temp(Src) 98 F (36.7 C) (Oral)   Resp 16   Ht 5\' 6"  (1.676 m)   Wt 162 lb (73.483 kg)   BMI 26.16 kg/m2   SpO2 98% [facility] reading (PRIMARY) by  Dr.Daub there is C4-C5 degenerative changes C5-C6 degenerative disc disease there is a retrolisthesis of C5. Shoulder films show significant a.c. joint arthritis. There is also a lobular soft tissue density seen on the shoulder film the radiologist feels this is most likely a benign soft tissue shadow      Assessment & Plan:  Referral made to go for orthopedics. Will treat with meloxicam 7.5 one to 2 daily with food   I personally performed the services described in this documentation, which was scribed in my presence. The recorded information has been reviewed and is accurate.

## 2013-07-08 NOTE — Progress Notes (Deleted)
   Subjective:    Patient ID: Amanda Swanson, female    DOB: 27-Feb-1951, 62 y.o.   MRN: [id number]  HPI    Review of Systems     Objective:   Physical Exam        Assessment & Plan:

## 2013-08-07 ENCOUNTER — Ambulatory Visit (INDEPENDENT_AMBULATORY_CARE_PROVIDER_SITE_OTHER): Payer: BC Managed Care – PPO | Admitting: Family Medicine

## 2013-08-07 VITALS — BP 160/98 | HR 97 | Temp 98.2°F | Resp 18 | Ht 66.0 in | Wt 159.0 lb

## 2013-08-07 DIAGNOSIS — R6883 Chills (without fever): Secondary | ICD-10-CM

## 2013-08-07 DIAGNOSIS — R110 Nausea: Secondary | ICD-10-CM

## 2013-08-07 LAB — POCT CBC
Granulocyte percent: 81.1 %G — AB (ref 37–80)
HCT, POC: 46 % (ref 37.7–47.9)
Hemoglobin: 14.7 g/dL (ref 12.2–16.2)
Lymph, poc: 1.4 (ref 0.6–3.4)
MCH, POC: 28.3 pg (ref 27–31.2)
MCHC: 32 g/dL (ref 31.8–35.4)
MCV: 88.7 fL (ref 80–97)
MID (cbc): 0.4 (ref 0–0.9)
MPV: 12.2 fL (ref 0–99.8)
POC Granulocyte: 7.9 — AB (ref 2–6.9)
POC LYMPH PERCENT: 14.5 %L (ref 10–50)
POC MID %: 4.4 %M (ref 0–12)
Platelet Count, POC: 201 10*3/uL (ref 142–424)
RBC: 5.19 M/uL (ref 4.04–5.48)
RDW, POC: 15.2 %
WBC: 9.8 10*3/uL (ref 4.6–10.2)

## 2013-08-07 LAB — POCT INFLUENZA A/B
Influenza A, POC: NEGATIVE
Influenza B, POC: NEGATIVE

## 2013-08-07 NOTE — Patient Instructions (Signed)
Nausea, Adult Nausea is the feeling that you have an upset stomach or have to vomit. Nausea by itself is not likely a serious concern, but it may be an early sign of more serious medical problems. As nausea gets worse, it can lead to vomiting. If vomiting develops, there is the risk of dehydration.  CAUSES   Viral infections.  Food poisoning.  Medicines.  Pregnancy.  Motion sickness.  Migraine headaches.  Emotional distress.  Severe pain from any source.  Alcohol intoxication. HOME CARE INSTRUCTIONS  Get plenty of rest.  Ask your caregiver about specific rehydration instructions.  Eat small amounts of food and sip liquids more often.  Take all medicines as told by your caregiver. SEEK MEDICAL CARE IF:  You have not improved after 2 days, or you get worse.  You have a headache. SEEK IMMEDIATE MEDICAL CARE IF:   You have a fever.  You faint.  You keep vomiting or have blood in your vomit.  You are extremely weak or dehydrated.  You have dark or bloody stools.  You have severe chest or abdominal pain. MAKE SURE YOU:  Understand these instructions.  Will watch your condition.  Will get help right away if you are not doing well or get worse. Document Released: 08/11/2004 Document Revised: 03/28/2012 Document Reviewed: 03/16/2011 Exit[employer] Patient Information 2014 [employer].  

## 2013-08-07 NOTE — Progress Notes (Signed)
Chief Complaint:  Chief Complaint  Patient presents with  . Nausea    HPI: Amanda Swanson is a 63 y.o. female who is here for  Nausea this morning, she was washing the car in garage for 30 min. Then she started feeling this way, She used a soap she has not used before. Before that she was moving the car and had the car running but just for a few seconds, and had the garage door open a crack. She was coughing and felt nauseated and felt really bad. She is feeling better now. She has allergies, mutliple allergies She is sensitive to smells.  Again She currently feels better She had a little diarrhea but not sure if related her nervousness, but she had some loose stools prior to this all happneing. She has had 3 epsiodesof  nonbloody loose stools.  No confusion or HA, vision changes, CP, SOB or wheezing. Earlier she was gagging, dry heave feeling in her chest. She may have dry heaved up her HTN med. They have a carbon monxide detector in the house, again the car only ran for a few seconds.  She has had chills Not UTD on flu vaccine Nausea occurred after coughing, has had sick contacts LMP age 10 y/o   Past Medical History  Diagnosis Date  . Allergy   . Cataract   . Heart murmur   . Hypertension    Past Surgical History  Procedure Laterality Date  . Breast surgery     History   Social History  . Marital Status: Divorced    Spouse Name: N/A    Number of Children: N/A  . Years of Education: N/A   Social History Main Topics  . Smoking status: Never Smoker   . Smokeless tobacco: None  . Alcohol Use: No  . Drug Use: No  . Sexual Activity: Yes   Other Topics Concern  . None   Social History Narrative   4 pregnancies   3 live births   1 miscarriage   Family History  Problem Relation Age of Onset  . Heart disease Father   . Cancer Sister     breast  . Diabetes Brother    No Known Allergies Prior to Admission medications   Medication Sig Start Date End  Date Taking? Authorizing Provider  clonazePAM (KLONOPIN) 0.5 MG tablet Take 1 tablet (0.5 mg total) by mouth 2 (two) times daily as needed for anxiety. Takes 1/2 pill prn. 04/03/13  Yes Tonye Pearson, MD  nebivolol (BYSTOLIC) 10 MG tablet Take 1 tablet (10 mg total) by mouth daily. 06/21/13  Yes Tonye Pearson, MD  cetirizine (ZYRTEC) 10 MG tablet Take 1 tablet (10 mg total) by mouth daily. 10/30/11 10/29/12  Tonye Pearson, MD  fluticasone (FLONASE) 50 MCG/ACT nasal spray Place 2 sprays into the nose daily. 10/30/11 10/29/12  Tonye Pearson, MD  hydrochlorothiazide (MICROZIDE) 12.5 MG capsule Take 1 capsule (12.5 mg total) by mouth daily. 02/10/12 02/09/13  Tonye Pearson, MD  meloxicam (MOBIC) 7.5 MG tablet Take 1-2 tablets daily 07/08/13   Collene Gobble, MD  Multiple Vitamin (MULTIVITAMIN) tablet Take by mouth daily. Takes 1/2 daily.    Historical Provider, MD  Olopatadine HCl 0.2 % SOLN Apply 1 drop to eye daily. 01/11/12   Tonye Pearson, MD  PRESCRIPTION MEDICATION Bystolic 5 mg taking every day.    Historical Provider, MD     ROS: The patient denies fevers, night  sweats, unintentional weight loss, chest pain, palpitations, wheezing, dyspnea on exertiion, abdominal pain, dysuria, hematuria, melena, numbness, weakness, or tingling.   All other systems have been reviewed and were otherwise negative with the exception of those mentioned in the HPI and as above.    PHYSICAL EXAM: Filed Vitals:   08/07/13 1145  BP: 160/98  Pulse: 97  Temp: 98.2 F (36.8 C)  Resp: 18  Spo2   99% Filed Vitals:   08/07/13 1145  Height: 5\' 6"  (1.676 m)  Weight: 159 lb (72.122 kg)   Body mass index is 25.68 kg/(m^2).  General: Alert, no acute distress HEENT:  Normocephalic, atraumatic, oropharynx patent. EOMI, PERRLA, tm nl, mucosa nl, no exuates Cardiovascular:  Regular rate and rhythm, no rubs murmurs or gallops.  No Carotid bruits, radial pulse intact. No pedal edema.  Respiratory:  Clear to auscultation bilaterally.  No wheezes, rales, or rhonchi.  No cyanosis, no use of accessory musculature GI: No organomegaly, abdomen is soft and non-tender, positive bowel sounds.  No masses. Skin: No rashes. Neurologic: Facial musculature symmetric. CN 2-12 grossly intact Psychiatric: Patient is appropriate throughout our interaction. Lymphatic: No cervical lymphadenopathy Musculoskeletal: Gait intact.   LABS: Results for orders placed in visit on 08/07/13  POCT CBC      Result Value Range   WBC 9.8  4.6 - 10.2 K/uL   Lymph, poc 1.4  0.6 - 3.4   POC LYMPH PERCENT 14.5  10 - 50 %L   MID (cbc) 0.4  0 - 0.9   POC MID % 4.4  0 - 12 %M   POC Granulocyte 7.9 (*) 2 - 6.9   Granulocyte percent 81.1 (*) 37 - 80 %G   RBC 5.19  4.04 - 5.48 M/uL   Hemoglobin 14.7  12.2 - 16.2 g/dL   HCT, POC 82.946.0  56.237.7 - 47.9 %   MCV 88.7  80 - 97 fL   MCH, POC 28.3  27 - 31.2 pg   MCHC 32.0  31.8 - 35.4 g/dL   RDW, POC 13.015.2     Platelet Count, POC 201  142 - 424 K/uL   MPV 12.2  0 - 99.8 fL  POCT INFLUENZA A/B      Result Value Range   Influenza A, POC Negative     Influenza B, POC Negative       EKG/XRAY:   Primary read [facility]   ASSESSMENT/PLAN: Encounter Diagnoses  Name Primary?  . Nausea alone Yes  . Chills (without fever)    ? Early onset of URI sxs or reaction to car soap and fumes in garage, less suspect of co poisoning since only several seconds with garage open.  Monitor for worsening symptoms, BRAT, decline nausea meds Monitor BP  If worsening sxs got to ER F/u prn  Gross sideeffects, risk and benefits, and alternatives of medications d/w patient. Patient is aware that all medications have potential sideeffects and we are unable to predict every sideeffect or drug-drug interaction that may occur.  Amanda Huffstetler PHUONG, DO 08/07/2013 1:21 PM

## 2013-08-08 LAB — COMPREHENSIVE METABOLIC PANEL WITH GFR
CO2: 27 meq/L (ref 19–32)
Calcium: 10.2 mg/dL (ref 8.4–10.5)
Creat: 0.78 mg/dL (ref 0.50–1.10)
Glucose, Bld: 143 mg/dL — ABNORMAL HIGH (ref 70–99)
Total Bilirubin: 0.6 mg/dL (ref 0.3–1.2)
Total Protein: 7.9 g/dL (ref 6.0–8.3)

## 2013-08-08 LAB — COMPREHENSIVE METABOLIC PANEL
ALT: 17 U/L (ref 0–35)
AST: 21 U/L (ref 0–37)
Albumin: 4.8 g/dL (ref 3.5–5.2)
Alkaline Phosphatase: 82 U/L (ref 39–117)
BUN: 12 mg/dL (ref 6–23)
Chloride: 103 mEq/L (ref 96–112)
Potassium: 4.1 mEq/L (ref 3.5–5.3)
Sodium: 140 mEq/L (ref 135–145)

## 2013-09-13 ENCOUNTER — Telehealth: Payer: Self-pay

## 2013-09-13 NOTE — Telephone Encounter (Signed)
Pt left vmail on referrals line requesting a referral to Dr Talmage NapBalan for her thyroid.   Pt best: [phone]  bf

## 2013-09-14 ENCOUNTER — Telehealth: Payer: Self-pay

## 2013-09-14 NOTE — Telephone Encounter (Signed)
Amanda Swanson patient calling to get referral to  Dr. Talmage NapBalan endocrinology - [address]. Patient aware [name] returns on Monday. Please advise.   [phone]

## 2013-09-16 NOTE — Telephone Encounter (Signed)
Patient called again on 09/16/13 concerned that she has not heard about her referral yet.  Please advise.

## 2013-09-16 NOTE — Telephone Encounter (Signed)
Call---last thyr tests done here were normal--has she had further testing?? If not we should test before sending to dr Talmage NapBalan or she may not accept the referral I could add her wed am [phone] and do testing if needed

## 2013-09-17 NOTE — Telephone Encounter (Signed)
Spoke with pt's husband. Added pt to Wednesday's schedule 09/18/13 at 9:45 to see Dr Merla Richesoolittle for testing.

## 2013-09-18 ENCOUNTER — Ambulatory Visit: Payer: BC Managed Care – PPO | Admitting: Internal Medicine

## 2013-11-07 ENCOUNTER — Other Ambulatory Visit: Payer: Self-pay | Admitting: Endocrinology

## 2013-11-07 DIAGNOSIS — E041 Nontoxic single thyroid nodule: Secondary | ICD-10-CM

## 2013-11-08 ENCOUNTER — Ambulatory Visit
Admission: RE | Admit: 2013-11-08 | Discharge: 2013-11-08 | Disposition: A | Discharge status: Home / Self Care | Payer: BC Managed Care – PPO | Source: Ambulatory Visit | Attending: Endocrinology | Admitting: Endocrinology

## 2013-11-08 DIAGNOSIS — E041 Nontoxic single thyroid nodule: Secondary | ICD-10-CM

## 2013-11-13 ENCOUNTER — Other Ambulatory Visit: Payer: Self-pay | Admitting: Endocrinology

## 2013-11-13 DIAGNOSIS — E041 Nontoxic single thyroid nodule: Secondary | ICD-10-CM

## 2014-02-18 ENCOUNTER — Ambulatory Visit (INDEPENDENT_AMBULATORY_CARE_PROVIDER_SITE_OTHER): Payer: BC Managed Care – PPO | Admitting: Emergency Medicine

## 2014-02-18 VITALS — BP 182/88 | HR 80 | Temp 98.2°F | Resp 16 | Ht 65.59 in | Wt 154.0 lb

## 2014-02-18 DIAGNOSIS — F419 Anxiety disorder, unspecified: Secondary | ICD-10-CM

## 2014-02-18 DIAGNOSIS — F411 Generalized anxiety disorder: Secondary | ICD-10-CM

## 2014-02-18 DIAGNOSIS — J309 Allergic rhinitis, unspecified: Secondary | ICD-10-CM

## 2014-02-18 MED ORDER — IPRATROPIUM BROMIDE 0.03 % NA SOLN: 2.0000 | Freq: Two times a day (BID) | NASAL | Status: DC

## 2014-02-18 NOTE — Progress Notes (Signed)
[facility] [address], [place] [id number] [phone]  Date:  02/18/2014   Name:  Amanda MasseMarcia Elvita Douglass   DOB:  [birth year]   MRN:  [id number]  PCP:  No primary provider on file.    Chief Complaint: Allergies and Shortness of Breath   History of Present Illness:  Amanda MasseMarcia Elvita Parlier is a 63 y.o. very pleasant female patient who presents with the following:  Claims "lots" of allergies to chemicals and perfumes.  Seen by allergist on Friday and offered a steroid injection and refused for fear of an increase in her blood pressure. Claims to have had a 1 1/2 year pressure elevation from a steroid injection in the knee.   Has nasal congestion and watery drainage and tightness in her chest after moving in to a different house.  No fever or chills.  Not using any antihistamine. Frequent anxiety attacks No fever or chills.  No wheezing.  Says she feels "awful" and weak.   No improvement with over the counter medications or other home remedies. Denies other complaint or health concern today.   Patient Active Problem List   Diagnosis Date Noted  . Palpitations 04/03/2013  . hyperlipidemia 02/10/2012  . Anxiety 08/26/2011  . HTN (hypertension) 08/26/2011    Past Medical History  Diagnosis Date  . Allergy   . Cataract   . Heart murmur   . Hypertension     Past Surgical History  Procedure Laterality Date  . Breast surgery      History  Substance Use Topics  . Smoking status: Never Smoker   . Smokeless tobacco: Never Used  . Alcohol Use: No    Family History  Problem Relation Age of Onset  . Heart disease Father   . Cancer Sister     breast  . Diabetes Brother     No Known Allergies  Medication list has been reviewed and updated.  Current Outpatient Prescriptions on File Prior to Visit  Medication Sig Dispense Refill  . clonazePAM (KLONOPIN) 0.5 MG tablet Take 1 tablet (0.5 mg total) by mouth 2 (two) times daily as needed for anxiety. Takes  1/2 pill prn.  60 tablet  0  . fluticasone (FLONASE) 50 MCG/ACT nasal spray Place 2 sprays into the nose daily.  16 g  6  . nebivolol (BYSTOLIC) 10 MG tablet Take 1 tablet (10 mg total) by mouth daily.  90 tablet  1  . hydrochlorothiazide (MICROZIDE) 12.5 MG capsule Take 1 capsule (12.5 mg total) by mouth daily.  30 capsule  5  . meloxicam (MOBIC) 7.5 MG tablet Take 1-2 tablets daily  30 tablet  0  . Multiple Vitamin (MULTIVITAMIN) tablet Take by mouth daily. Takes 1/2 daily.      . Olopatadine HCl 0.2 % SOLN Apply 1 drop to eye daily.  1 Bottle  11   No current facility-administered medications on file prior to visit.    Review of Systems:  As per HPI, otherwise negative.    Physical Examination: Filed Vitals:   02/18/14 1815  BP: 182/88  Pulse: 80  Temp: 98.2 F (36.8 C)  Resp: 16   Filed Vitals:   02/18/14 1815  Height: 5' 5.59" (1.666 m)  Weight: 154 lb (69.854 kg)   Body mass index is 25.17 kg/(m^2). Ideal Body Weight: Weight in (lb) to have BMI = 25: 152.7   GEN: WDWN, NAD, Non-toxic, Alert & Oriented x 3 HEENT: Atraumatic, Normocephalic.  Ears and Nose: No  external deformity. EXTR: No clubbing/cyanosis/edema NEURO: Normal gait.  PSYCH: Normally interactive. Conversant. Not depressed or anxious appearing.  Calm demeanor.  CHEST clear BS=  Assessment and Plan: Allergies Ipratropium nasal spray  Signed,  Phillips Odor, MD

## 2014-02-18 NOTE — Patient Instructions (Signed)

## 2014-04-16 ENCOUNTER — Ambulatory Visit (INDEPENDENT_AMBULATORY_CARE_PROVIDER_SITE_OTHER): Payer: BC Managed Care – PPO | Admitting: Internal Medicine

## 2014-04-16 ENCOUNTER — Encounter: Payer: Self-pay | Admitting: Internal Medicine

## 2014-04-16 VITALS — BP 156/85 | HR 82 | Temp 98.5°F | Resp 16 | Ht 66.0 in | Wt 150.0 lb

## 2014-04-16 DIAGNOSIS — R739 Hyperglycemia, unspecified: Secondary | ICD-10-CM

## 2014-04-16 DIAGNOSIS — E785 Hyperlipidemia, unspecified: Secondary | ICD-10-CM

## 2014-04-16 DIAGNOSIS — R002 Palpitations: Secondary | ICD-10-CM

## 2014-04-16 DIAGNOSIS — I10 Essential (primary) hypertension: Secondary | ICD-10-CM

## 2014-04-16 DIAGNOSIS — Z0000 Encounter for general adult medical examination without abnormal findings: Secondary | ICD-10-CM

## 2014-04-16 DIAGNOSIS — F419 Anxiety disorder, unspecified: Secondary | ICD-10-CM

## 2014-04-16 DIAGNOSIS — F411 Generalized anxiety disorder: Secondary | ICD-10-CM

## 2014-04-16 DIAGNOSIS — R7309 Other abnormal glucose: Secondary | ICD-10-CM

## 2014-04-16 LAB — POCT GLYCOSYLATED HEMOGLOBIN (HGB A1C): Hemoglobin A1C: 5.6

## 2014-04-16 MED ORDER — NEBIVOLOL HCL 10 MG PO TABS: 10.0000 mg | ORAL_TABLET | Freq: Every day | ORAL | Status: DC

## 2014-04-16 NOTE — Progress Notes (Signed)
   Subjective:    Patient ID: Amanda Swanson, female    DOB: March 28, 1951, 63 y.o.   MRN: [id number]  [facility] Patient Active Problem List   Diagnosis Date Noted  . Palpitations 04/03/2013  . hyperlipidemia 02/10/2012  . Anxiety 08/26/2011  . HTN (hypertension) 08/26/2011  doing well except developed allergic sxt thoght due to mult chem sens- Dr Hicks/Koslow--mult chem sens//not well controlled---reacts to their meds so quits  Even singulair made heart race Homeopath in [place] has helped her but still sxt often but better by avoidance/masks  Searching for immuniz records Review of Systems  HENT: Positive for sinus pressure and sore throat.   Eyes: Positive for photophobia.  Respiratory: Positive for shortness of breath.   Musculoskeletal: Positive for neck stiffness.  Allergic/Immunologic: Positive for environmental allergies and food allergies.  rest neg     Objective:   Physical Exam  Nursing note and vitals reviewed. Constitutional: She is oriented to person, place, and time. She appears well-developed and well-nourished. No distress.  HENT:  Head: Normocephalic.  Right Ear: External ear normal.  Left Ear: External ear normal.  Nose: Nose normal.  Mouth/Throat: Oropharynx is clear and moist.  Eyes: Conjunctivae and EOM are normal. Pupils are equal, round, and reactive to light.  Neck: Normal range of motion. Neck supple. No thyromegaly present.  Cardiovascular: Normal rate, regular rhythm, normal heart sounds and intact distal pulses.   No murmur heard. Pulmonary/Chest: Effort normal and breath sounds normal. She has no wheezes.  Abdominal: Soft. Bowel sounds are normal. She exhibits no distension and no mass. There is no tenderness. There is no rebound.  Musculoskeletal: Normal range of motion. She exhibits no edema and no tenderness.  Lymphadenopathy:    She has no cervical adenopathy.  Neurological: She is alert and oriented to person, place, and time. She has normal  reflexes. No cranial nerve deficit.  Skin: Skin is warm and dry. No rash noted.  Psychiatric: She has a normal mood and affect. Her behavior is normal. Judgment and thought content normal.   Wt Readings from Last 3 Encounters:  04/16/14 150 lb (68.04 kg)  02/18/14 154 lb (69.854 kg)  08/07/13 159 lb (72.122 kg)  BP 156/85  Pulse 82  Temp(Src) 98.5 F (36.9 C)  Resp 16  Ht 5\' 6"  (1.676 m)  Wt 150 lb (68.04 kg)  BMI 24.22 kg/m2  SpO2 99%      Assessment & Plan:  CPE HTN Anxiety HL  Meds ordered this encounter  Medications  . nebivolol (BYSTOLIC) 10 MG tablet    Sig: Take 1 tablet (10 mg total) by mouth daily.    Dispense:  90 tablet    Refill:  3   Labs congrats on wt loss

## 2014-04-17 LAB — COMPREHENSIVE METABOLIC PANEL
ALK PHOS: 66 U/L (ref 39–117)
ALT: 18 U/L (ref 0–35)
AST: 18 U/L (ref 0–37)
Albumin: 4.5 g/dL (ref 3.5–5.2)
BILIRUBIN TOTAL: 0.7 mg/dL (ref 0.2–1.2)
BUN: 12 mg/dL (ref 6–23)
CO2: 28 meq/L (ref 19–32)
CREATININE: 0.82 mg/dL (ref 0.50–1.10)
Calcium: 9.9 mg/dL (ref 8.4–10.5)
Chloride: 103 mEq/L (ref 96–112)
Glucose, Bld: 96 mg/dL (ref 70–99)
Potassium: 4.3 mEq/L (ref 3.5–5.3)
SODIUM: 142 meq/L (ref 135–145)
TOTAL PROTEIN: 7.3 g/dL (ref 6.0–8.3)

## 2014-04-17 LAB — TSH: TSH: 1.191 u[IU]/mL (ref 0.350–4.500)

## 2014-04-17 LAB — LIPID PANEL
CHOL/HDL RATIO: 3.8 ratio
CHOLESTEROL: 204 mg/dL — AB (ref 0–200)
HDL: 54 mg/dL (ref >39)
LDL CALC: 121 mg/dL — AB (ref 0–99)
Triglycerides: 144 mg/dL (ref <150)
VLDL: 29 mg/dL (ref 0–40)

## 2014-04-17 LAB — CBC WITH DIFFERENTIAL/PLATELET
BASOS PCT: 0 % (ref 0–1)
Basophils Absolute: 0 10*3/uL (ref 0.0–0.1)
EOS ABS: 0.1 10*3/uL (ref 0.0–0.7)
EOS PCT: 2 % (ref 0–5)
HCT: 44 % (ref 36.0–46.0)
Hemoglobin: 14.9 g/dL (ref 12.0–15.0)
LYMPHS ABS: 1.7 10*3/uL (ref 0.7–4.0)
Lymphocytes Relative: 23 % (ref 12–46)
MCH: 27.6 pg (ref 26.0–34.0)
MCHC: 33.9 g/dL (ref 30.0–36.0)
MCV: 81.5 fL (ref 78.0–100.0)
Monocytes Absolute: 0.4 10*3/uL (ref 0.1–1.0)
Monocytes Relative: 6 % (ref 3–12)
Neutro Abs: 5 10*3/uL (ref 1.7–7.7)
Neutrophils Relative %: 69 % (ref 43–77)
PLATELETS: 193 10*3/uL (ref 150–400)
RBC: 5.4 MIL/uL — ABNORMAL HIGH (ref 3.87–5.11)
RDW: 15.4 % (ref 11.5–15.5)
WBC: 7.2 10*3/uL (ref 4.0–10.5)

## 2014-04-21 ENCOUNTER — Encounter: Payer: Self-pay | Admitting: Internal Medicine

## 2014-04-28 ENCOUNTER — Other Ambulatory Visit: Payer: BC Managed Care – PPO

## 2014-05-09 ENCOUNTER — Ambulatory Visit
Admission: RE | Admit: 2014-05-09 | Discharge: 2014-05-09 | Disposition: A | Discharge status: Home / Self Care | Payer: BC Managed Care – PPO | Source: Ambulatory Visit | Attending: Endocrinology | Admitting: Endocrinology

## 2014-05-09 DIAGNOSIS — E041 Nontoxic single thyroid nodule: Secondary | ICD-10-CM

## 2014-05-29 ENCOUNTER — Telehealth: Payer: Self-pay

## 2014-05-29 NOTE — Telephone Encounter (Signed)
Dr.Doolittle, Pt states that you wanted to know the date of her last tetanus shot, she states that the date was January of 2010. She received that shot [facility] [place]. Best# [phone]

## 2014-05-29 NOTE — Telephone Encounter (Signed)
[facility] to chart- [facility]

## 2014-12-13 ENCOUNTER — Ambulatory Visit (INDEPENDENT_AMBULATORY_CARE_PROVIDER_SITE_OTHER): Payer: BC Managed Care – PPO | Admitting: Emergency Medicine

## 2014-12-13 VITALS — BP 150/90 | HR 97 | Temp 98.5°F | Ht 66.0 in | Wt 158.0 lb

## 2014-12-13 DIAGNOSIS — I10 Essential (primary) hypertension: Secondary | ICD-10-CM

## 2014-12-13 DIAGNOSIS — R002 Palpitations: Secondary | ICD-10-CM | POA: Diagnosis not present

## 2014-12-13 LAB — COMPREHENSIVE METABOLIC PANEL
ALK PHOS: 74 U/L (ref 39–117)
ALT: 14 U/L (ref 0–35)
AST: 18 U/L (ref 0–37)
Albumin: 4.4 g/dL (ref 3.5–5.2)
BILIRUBIN TOTAL: 0.5 mg/dL (ref 0.2–1.2)
BUN: 11 mg/dL (ref 6–23)
CALCIUM: 9.8 mg/dL (ref 8.4–10.5)
CO2: 24 mEq/L (ref 19–32)
Chloride: 103 mEq/L (ref 96–112)
Creat: 0.77 mg/dL (ref 0.50–1.10)
Glucose, Bld: 136 mg/dL — ABNORMAL HIGH (ref 70–99)
Potassium: 3.7 mEq/L (ref 3.5–5.3)
SODIUM: 137 meq/L (ref 135–145)
TOTAL PROTEIN: 7.3 g/dL (ref 6.0–8.3)

## 2014-12-13 LAB — CBC WITH DIFFERENTIAL/PLATELET
BASOS PCT: 0 % (ref 0–1)
Basophils Absolute: 0 10*3/uL (ref 0.0–0.1)
Eosinophils Absolute: 0 10*3/uL (ref 0.0–0.7)
Eosinophils Relative: 0 % (ref 0–5)
HEMATOCRIT: 42.8 % (ref 36.0–46.0)
Hemoglobin: 14.4 g/dL (ref 12.0–15.0)
LYMPHS PCT: 12 % (ref 12–46)
Lymphs Abs: 0.9 10*3/uL (ref 0.7–4.0)
MCH: 28.3 pg (ref 26.0–34.0)
MCHC: 33.6 g/dL (ref 30.0–36.0)
MCV: 84.1 fL (ref 78.0–100.0)
MONO ABS: 0.3 10*3/uL (ref 0.1–1.0)
MPV: 12.3 fL (ref 8.6–12.4)
Monocytes Relative: 4 % (ref 3–12)
NEUTROS PCT: 84 % — AB (ref 43–77)
Neutro Abs: 6.4 10*3/uL (ref 1.7–7.7)
PLATELETS: 222 10*3/uL (ref 150–400)
RBC: 5.09 MIL/uL (ref 3.87–5.11)
RDW: 14 % (ref 11.5–15.5)
WBC: 7.6 10*3/uL (ref 4.0–10.5)

## 2014-12-13 LAB — LIPID PANEL
Cholesterol: 207 mg/dL — ABNORMAL HIGH (ref 0–200)
HDL: 65 mg/dL (ref >=46)
LDL Cholesterol: 125 mg/dL — ABNORMAL HIGH (ref 0–99)
Total CHOL/HDL Ratio: 3.2 Ratio
Triglycerides: 84 mg/dL (ref <150)
VLDL: 17 mg/dL (ref 0–40)

## 2014-12-13 LAB — TSH: TSH: 0.553 u[IU]/mL (ref 0.350–4.500)

## 2014-12-13 MED ORDER — METOPROLOL SUCCINATE ER 50 MG PO TB24: 50.0000 mg | ORAL_TABLET | Freq: Every day | ORAL | Status: DC

## 2014-12-13 NOTE — Progress Notes (Signed)
Subjective:  Patient ID: Amanda Swanson, female    DOB: 1950-12-02  Age: 64 y.o. MRN: [id number]  CC: Palpitations; Abdominal Pain; Nausea; and Gas   HPI Amanda Swanson presents for evaluation for palpitations. She has irritable bowel syndrome and has had 4 episodes of diarrhea today and abdominal pain that's been intermittent. She denies any nausea or vomiting fever chills. No genitourinary symptoms no gynecologic symptoms. She said the morning she had an episode of palpitations she said she did not count her heart rate. Was running very fast. She denied any chest pain tightness heaviness pressure or radiation of pain. She had some resting shortness of breath.  She has taken no medication relieve her symptoms. She said that she had an episode of palpitations in the past was worked up by cardiology she doesn't really remember the test she had nor the name of the diagnosis she does say that she had a "murmur". She has a history of hypertension is been on by systolic. She's been taking half a pill daily and she is currently not well-controlled.  Outpatient Prescriptions Prior to Visit  Medication Sig Dispense Refill  . clonazePAM (KLONOPIN) 0.5 MG tablet Take 1 tablet (0.5 mg total) by mouth 2 (two) times daily as needed for anxiety. Takes 1/2 pill prn. 60 tablet 0  . Multiple Vitamin (MULTIVITAMIN) tablet Take by mouth daily. Takes 1/2 daily.    . nebivolol (BYSTOLIC) 10 MG tablet Take 1 tablet (10 mg total) by mouth daily. 90 tablet 3  . Olopatadine HCl 0.2 % SOLN Apply 1 drop to eye daily. 1 Bottle 11   No facility-administered medications prior to visit.    History   Social History  . Marital Status: Divorced    Spouse Name: N/A  . Number of Children: N/A  . Years of Education: N/A   Social History Main Topics  . Smoking status: Never Smoker   . Smokeless tobacco: Never Used  . Alcohol Use: No  . Drug Use: No  . Sexual Activity: Yes   Other Topics Concern  . None    Social History Narrative   4 pregnancies   3 live births   1 miscarriage    Family History  Problem Relation Age of Onset  . Heart disease Father   . Cancer Sister     breast  . Diabetes Sister   . Diabetes Brother   . Hypertension Brother   . Cancer Mother   . Diabetes Brother     Past Medical History  Diagnosis Date  . Allergy   . Cataract   . Heart murmur   . Hypertension   . Anxiety      Review of Systems  Constitutional: Negative for fever, chills and appetite change.  HENT: Negative for congestion, ear pain, postnasal drip, sinus pressure and sore throat.   Eyes: Negative for pain and redness.  Respiratory: Negative for cough, shortness of breath and wheezing.   Cardiovascular: Positive for palpitations. Negative for chest pain and leg swelling.  Gastrointestinal: Positive for abdominal pain and diarrhea. Negative for nausea, vomiting, constipation and blood in stool.  Endocrine: Negative for polyuria.  Genitourinary: Negative for dysuria, urgency, frequency and flank pain.  Musculoskeletal: Negative for gait problem.  Skin: Negative for rash.  Neurological: Negative for weakness and headaches.  Psychiatric/Behavioral: Negative for confusion and decreased concentration. The patient is not nervous/anxious.     Objective:  BP 150/90 mmHg  Pulse 97  Temp(Src) 98.5 F (36.9 C) (  Oral)  Ht  (1.676 m)  Wt 158 lb (71.668 kg)  BMI 25.51 kg/m2  SpO2 99%  BP Readings from Last 3 Encounters:  12/13/14 150/90  04/16/14 156/85  02/18/14 182/88    Wt Readings from Last 3 Encounters:  12/13/14 158 lb (71.668 kg)  04/16/14 150 lb (68.04 kg)  02/18/14 154 lb (69.854 kg)    Physical Exam  Constitutional: She is oriented to person, place, and time. She appears well-developed and well-nourished. No distress.  HENT:  Head: Normocephalic and atraumatic.  Right Ear: External ear normal.  Left Ear: External ear normal.  Nose: Nose normal.  Eyes:  Conjunctivae and EOM are normal. Pupils are equal, round, and reactive to light. No scleral icterus.  Neck: Normal range of motion. Neck supple. No tracheal deviation present.  Cardiovascular: Normal rate, regular rhythm and normal heart sounds.   Pulmonary/Chest: Effort normal. No respiratory distress. She has no wheezes. She has no rales.  Abdominal: She exhibits no mass. There is no tenderness. There is no rebound and no guarding.  Musculoskeletal: She exhibits no edema.  Lymphadenopathy:    She has no cervical adenopathy.  Neurological: She is alert and oriented to person, place, and time. Coordination normal.  Skin: Skin is warm and dry. No rash noted.  Psychiatric: She has a normal mood and affect. Her behavior is normal.    Lab Results  Component Value Date   WBC 7.2 04/16/2014   HGB 14.9 04/16/2014   HCT 44.0 04/16/2014   PLT 193 04/16/2014   GLUCOSE 96 04/16/2014   CHOL 204* 04/16/2014   TRIG 144 04/16/2014   HDL 54 04/16/2014   LDLCALC 121* 04/16/2014   ALT 18 04/16/2014   AST 18 04/16/2014   NA 142 04/16/2014   K 4.3 04/16/2014   CL 103 04/16/2014   CREATININE 0.82 04/16/2014   BUN 12 04/16/2014   CO2 28 04/16/2014   TSH 1.191 04/16/2014   HGBA1C 5.6 04/16/2014      .  Assessment & Plan:   Kensly was seen today for palpitations, abdominal pain, nausea and gas.  Diagnoses and all orders for this visit:  Heart palpitations Orders: -     EKG 12-Lead -     Ambulatory referral to Cardiology -     CBC with Differential/Platelet -     Comprehensive metabolic panel -     TSH -     Lipid panel  Essential hypertension Orders: -     Ambulatory referral to Cardiology -     CBC with Differential/Platelet -     Comprehensive metabolic panel -     TSH -     Lipid panel  Other orders -     metoprolol succinate (TOPROL-XL) 50 MG 24 hr tablet; Take 1 tablet (50 mg total) by mouth daily. Take with or immediately following a meal.   I have discontinued Ms.  Shankel's Olopatadine HCl. I am also having her start on metoprolol succinate. Additionally, I am having her maintain her multivitamin, clonazePAM, and nebivolol.  Meds ordered this encounter  Medications  . metoprolol succinate (TOPROL-XL) 50 MG 24 hr tablet    Sig: Take 1 tablet (50 mg total) by mouth daily. Take with or immediately following a meal.    Dispense:  90 tablet    Refill:  3   She was instructed to increase her her Brevibloc to 1 pill daily and this disregard the prescription for the Toprol-XL. She'll await her appointment with  cardiology. And she was informed about red flags including chest pain tightness heaviness pressure continued tachycardia or dizziness she'll follow up with the emergency room in an event.  Follow-up: Return in about 1 month (around 01/13/2015).  Carmelina DaneAnderson, Evalette Montrose S, MD

## 2014-12-13 NOTE — Patient Instructions (Signed)

## 2015-01-01 ENCOUNTER — Ambulatory Visit (INDEPENDENT_AMBULATORY_CARE_PROVIDER_SITE_OTHER): Payer: BC Managed Care – PPO | Admitting: Family Medicine

## 2015-01-01 ENCOUNTER — Ambulatory Visit (INDEPENDENT_AMBULATORY_CARE_PROVIDER_SITE_OTHER): Payer: BC Managed Care – PPO

## 2015-01-01 ENCOUNTER — Telehealth: Payer: Self-pay

## 2015-01-01 VITALS — BP 136/82 | HR 78 | Temp 98.5°F | Resp 16 | Ht 66.0 in | Wt 156.6 lb

## 2015-01-01 DIAGNOSIS — K589 Irritable bowel syndrome without diarrhea: Secondary | ICD-10-CM | POA: Diagnosis not present

## 2015-01-01 DIAGNOSIS — R739 Hyperglycemia, unspecified: Secondary | ICD-10-CM | POA: Diagnosis not present

## 2015-01-01 DIAGNOSIS — R079 Chest pain, unspecified: Secondary | ICD-10-CM | POA: Diagnosis not present

## 2015-01-01 DIAGNOSIS — R9431 Abnormal electrocardiogram [ECG] [EKG]: Secondary | ICD-10-CM

## 2015-01-01 DIAGNOSIS — R112 Nausea with vomiting, unspecified: Secondary | ICD-10-CM

## 2015-01-01 DIAGNOSIS — R140 Abdominal distension (gaseous): Secondary | ICD-10-CM | POA: Diagnosis not present

## 2015-01-01 DIAGNOSIS — R197 Diarrhea, unspecified: Secondary | ICD-10-CM

## 2015-01-01 DIAGNOSIS — R1084 Generalized abdominal pain: Secondary | ICD-10-CM

## 2015-01-01 LAB — COMPLETE METABOLIC PANEL WITH GFR
ALK PHOS: 81 U/L (ref 39–117)
ALT: 14 U/L (ref 0–35)
AST: 18 U/L (ref 0–37)
Albumin: 4.6 g/dL (ref 3.5–5.2)
BUN: 10 mg/dL (ref 6–23)
CHLORIDE: 102 meq/L (ref 96–112)
CO2: 27 mEq/L (ref 19–32)
CREATININE: 0.77 mg/dL (ref 0.50–1.10)
Calcium: 10.4 mg/dL (ref 8.4–10.5)
GFR, Est Non African American: 82 mL/min
GLUCOSE: 108 mg/dL — AB (ref 70–99)
POTASSIUM: 4.1 meq/L (ref 3.5–5.3)
Sodium: 141 mEq/L (ref 135–145)
TOTAL PROTEIN: 8 g/dL (ref 6.0–8.3)
Total Bilirubin: 0.5 mg/dL (ref 0.2–1.2)

## 2015-01-01 LAB — POCT CBC
Granulocyte percent: 80.5 %G — AB (ref 37–80)
HCT, POC: 44.3 % (ref 37.7–47.9)
HEMOGLOBIN: 14.3 g/dL (ref 12.2–16.2)
Lymph, poc: 1.8 (ref 0.6–3.4)
MCH, POC: 27 pg (ref 27–31.2)
MCHC: 32.4 g/dL (ref 31.8–35.4)
MCV: 83.3 fL (ref 80–97)
MID (cbc): 0.1 (ref 0–0.9)
MPV: 10.1 fL (ref 0–99.8)
POC Granulocyte: 8.1 — AB (ref 2–6.9)
POC LYMPH PERCENT: 18.4 %L (ref 10–50)
POC MID %: 1.1 % (ref 0–12)
Platelet Count, POC: 187 10*3/uL (ref 142–424)
RBC: 5.31 M/uL (ref 4.04–5.48)
RDW, POC: 15.3 %
WBC: 10 10*3/uL (ref 4.6–10.2)

## 2015-01-01 LAB — LIPASE: Lipase: 18 U/L (ref 0–75)

## 2015-01-01 LAB — GLUCOSE, POCT (MANUAL RESULT ENTRY): POC Glucose: 103 mg/dl — AB (ref 70–99)

## 2015-01-01 MED ORDER — ONDANSETRON 4 MG PO TBDP: 4.0000 mg | ORAL_TABLET | Freq: Three times a day (TID) | ORAL | Status: DC | PRN

## 2015-01-01 NOTE — Telephone Encounter (Signed)
Spoke with pt, advised to come in.

## 2015-01-01 NOTE — Telephone Encounter (Signed)
Pt called in stating she is having a lot of stomach pain. She does have IBS, but feels like it may be something else. She wanted me to give her advise on what to do. I told she should come in to be seen. She wanted a message put in first. Please advise at [phone]

## 2015-01-01 NOTE — Progress Notes (Addendum)
Subjective:  This chart was scribed for Amanda Ray, MD by [facility], medical scribe at [facility].The patient was seen in exam room 12 and the patient's care was started at 2:16 PM.   Patient ID: Amanda Swanson, female    DOB: 23-Jul-1950, 64 y.o.   MRN: [id number] Chief Complaint  Patient presents with  . Abdominal Pain    upper abdominal pain  . Emesis    twice  . Diarrhea    x today   HPI HPI Comments: Amanda Swanson is a 64 y.o. female who presents to [facility] complaining of recurrent upper quadrant abdominal gas pain that radiates to her back, bloating, nausea, emesis x2 and diarrhea x2, worse yesterday but intermittent for several years. Frequent burping and passing pass. She has taken one pepcid a few hours pta. She frequently eats a xylitol spry peppermints about 5-10 a day. No blood in vomit or stool. No sick contacts. She has not eaten much today but has had some tea. She has a history of IBS, typically feels lower quadrant abdominal pain, gas pain, nausea, vomiting and intermittent diarrhea. No abdominal surgeries. Due for colonoscopy next year, last one five years. Seen by [facility] where she has her colonoscopy done.  Chest Pain: After exercising yesterday and she felt some soreness in the middle of her chest. She has an appointment with cardiology in August. She was seen recently by Dr. Ouida Sills for chest pain and palpations symptoms. No chest pain today. Labs from last visit reviewed.  Results for orders placed or performed in visit on 12/13/14  CBC with Differential/Platelet  Result Value Ref Range   WBC 7.6 4.0 - 10.5 K/uL   RBC 5.09 3.87 - 5.11 MIL/uL   Hemoglobin 14.4 12.0 - 15.0 g/dL   HCT 42.8 36.0 - 46.0 %   MCV 84.1 78.0 - 100.0 fL   MCH 28.3 26.0 - 34.0 pg   MCHC 33.6 30.0 - 36.0 g/dL   RDW 14.0 11.5 - 15.5 %   Platelets 222 150 - 400 K/uL   MPV 12.3 8.6 - 12.4 fL   Neutrophils  Relative % 84 (H) 43 - 77 %   Neutro Abs 6.4 1.7 - 7.7 K/uL   Lymphocytes Relative 12 12 - 46 %   Lymphs Abs 0.9 0.7 - 4.0 K/uL   Monocytes Relative 4 3 - 12 %   Monocytes Absolute 0.3 0.1 - 1.0 K/uL   Eosinophils Relative 0 0 - 5 %   Eosinophils Absolute 0.0 0.0 - 0.7 K/uL   Basophils Relative 0 0 - 1 %   Basophils Absolute 0.0 0.0 - 0.1 K/uL   Smear Review Criteria for review not met   Comprehensive metabolic panel  Result Value Ref Range   Sodium 137 135 - 145 mEq/L   Potassium 3.7 3.5 - 5.3 mEq/L   Chloride 103 96 - 112 mEq/L   CO2 24 19 - 32 mEq/L   Glucose, Bld 136 (H) 70 - 99 mg/dL   BUN 11 6 - 23 mg/dL   Creat 0.77 0.50 - 1.10 mg/dL   Total Bilirubin 0.5 0.2 - 1.2 mg/dL   Alkaline Phosphatase 74 39 - 117 U/L   AST 18 0 - 37 U/L   ALT 14 0 - 35 U/L   Total Protein 7.3 6.0 - 8.3 g/dL   Albumin 4.4 3.5 - 5.2 g/dL   Calcium 9.8 8.4 - 10.5 mg/dL  TSH  Result Value Ref Range   TSH 0.553 0.350 - 4.500 uIU/mL  Lipid panel  Result Value Ref Range   Cholesterol 207 (H) 0 - 200 mg/dL   Triglycerides 84 <150 mg/dL   HDL 65 >=46 mg/dL   Total CHOL/HDL Ratio 3.2 Ratio   VLDL 17 0 - 40 mg/dL   LDL Cholesterol 125 (H) 0 - 99 mg/dL      Patient Active Problem List   Diagnosis Date Noted  . Palpitations 04/03/2013  . hyperlipidemia 02/10/2012  . Anxiety 08/26/2011  . HTN (hypertension) 08/26/2011   Past Medical History  Diagnosis Date  . Allergy   . Cataract   . Heart murmur   . Hypertension   . Anxiety   . IBS (irritable bowel syndrome)    Past Surgical History  Procedure Laterality Date  . Breast surgery     No Known Allergies Prior to Admission medications   Medication Sig Start Date End Date Taking? Authorizing Provider  clonazePAM (KLONOPIN) 0.5 MG tablet Take 1 tablet (0.5 mg total) by mouth 2 (two) times daily as needed for anxiety. Takes 1/2 pill prn. 04/03/13  Yes Leandrew Koyanagi, MD  Multiple Vitamin (MULTIVITAMIN) tablet Take by mouth daily. Takes  1/2 daily.   Yes Historical Provider, MD  nebivolol (BYSTOLIC) 10 MG tablet Take 1 tablet (10 mg total) by mouth daily. 04/16/14  Yes Leandrew Koyanagi, MD   History   Social History  . Marital Status: Divorced    Spouse Name: N/A  . Number of Children: N/A  . Years of Education: N/A   Occupational History  . Not on file.   Social History Main Topics  . Smoking status: Never Smoker   . Smokeless tobacco: Never Used  . Alcohol Use: No  . Drug Use: No  . Sexual Activity: Yes   Other Topics Concern  . Not on file   Social History Narrative   4 pregnancies   3 live births   1 miscarriage   Review of Systems  Gastrointestinal: Positive for nausea, vomiting, abdominal pain, diarrhea and abdominal distention. Negative for constipation and blood in stool.      Objective:  BP 136/82 mmHg  Pulse 78  Temp(Src) 98.5 F (36.9 C) (Oral)  Resp 16  Ht _0  (1.676 m)  Wt 156 lb 9.6 oz (71.033 kg)  BMI 25.29 kg/m2  SpO2 99% Physical Exam  Constitutional: She is oriented to person, place, and time. She appears well-developed and well-nourished. No distress.  HENT:  Head: Normocephalic and atraumatic.  Eyes: Pupils are equal, round, and reactive to light.  Neck: Normal range of motion.  Cardiovascular: Normal rate and regular rhythm.   Pulmonary/Chest: Effort normal. No respiratory distress.  Abdominal: Bowel sounds are normal. She exhibits no distension. There is no tenderness at McBurney's point and negative Murphy's sign.  No high pitched bowel sounds. Slight discomfort epigastric only.  Musculoskeletal: Normal range of motion.  Neurological: She is alert and oriented to person, place, and time.  Skin: Skin is warm and dry.  Psychiatric: She has a normal mood and affect. Her behavior is normal.  Nursing note and vitals reviewed. [facility] reading (PRIMARY) by Dr.Floetta Brickey: Acute abdominal series no specific bowel gas findings. Border line cardiomegaly on chest x-Swanson without acute  findings. EKG: Compared to May 28 th 2016 no specific T-wave flatting in III. No specific ST depression V3-V6 without significant change from last reading. Results for orders placed or performed in visit  on 01/01/15  POCT CBC  Result Value Ref Range   WBC 10.0 4.6 - 10.2 K/uL   Lymph, poc 1.8 0.6 - 3.4   POC LYMPH PERCENT 18.4 10 - 50 %L   MID (cbc) 0.1 0 - 0.9   POC MID % 1.1 0 - 12 %M   POC Granulocyte 8.1 (A) 2 - 6.9   Granulocyte percent 80.5 (A) 37 - 80 %G   RBC 5.31 4.04 - 5.48 M/uL   Hemoglobin 14.3 12.2 - 16.2 g/dL   HCT, POC 44.3 37.7 - 47.9 %   MCV 83.3 80 - 97 fL   MCH, POC 27.0 27 - 31.2 pg   MCHC 32.4 31.8 - 35.4 g/dL   RDW, POC 15.3 %   Platelet Count, POC 187 142 - 424 K/uL   MPV 10.1 0 - 99.8 fL  POCT glucose (manual entry)  Result Value Ref Range   POC Glucose 103 (A) 70 - 99 mg/dl        Assessment & Plan:   Cesilia Shinn is a 64 y.o. female Diarrhea, IBS,  Abdominal pain, Abdominal bloating, Non-intractable vomiting with nausea, vomiting of unspecified type - Plan: Ambulatory referral to Gastroenterology, ondansetron (ZOFRAN ODT) 4 MG disintegrating tablet  - possible multifactorial with IBS, viral syndrome or xylitol side effects. No acute findings on abdominal series, cbc WNL.   -stop mints/gum with xylitol or sorbitol.   -Bland diet, fluids, zofran if needed for nausea.    -Check CMP, lipase.   -refer to GI for eval of recurrent bloating/diarrhea and possible diarrhea predominant IBS.   -RTC precautions.   Hyperglycemia - Plan: POCT glucose (manual entry)  -on prior labs. Ok in office.   Chest pain, unspecified chest pain type - Plan: EKG 12-Lead Nonspecific abnormal electrocardiogram (ECG) (EKG)  -nonspecific lateral ST waves. No current chest pain. Will arrange for cardiology eval sooner, but ER/911 if chest pain returns and avoid exertional activities until that time.    Meds ordered this encounter  Medications  . ondansetron (ZOFRAN ODT)  4 MG disintegrating tablet    Sig: Take 1 tablet (4 mg total) by mouth every 8 (eight) hours as needed for nausea or vomiting.    Dispense:  10 tablet    Refill:  0   Patient Instructions  Cut back on gum or mints with sorbitol or xylitol as this substance has been associated with increased gas/bloating and diarrhea. You may also have a flair of IBS or virus.  If needed for nausea and vomiting - you can take Zofran up to every 8 hours (but this can cause constipation, on only use if needed).  Fluids, bland foods next few days. You should receive a call or letter about your lab results within the next week to 10 days. I will refer you to gastroenterologist, but if abdominal symptoms worsening, fever, or other new/worsening symptoms - return here or emergency room.  You should receive a call or letter about your lab results within the next week to 10 days.   Your EKG is slightly abnormal. Avoid exertional exercise for now and we will try to have you evaluated sooner with cardiology. If any return of chest pain, palpitations, short of breath or worsening - be seen in emergency room or call 911.   Abdominal Pain Many things can cause abdominal pain. Usually, abdominal pain is not caused by a disease and will improve without treatment. It can often be observed and treated at home. Your  health care provider will do a physical exam and possibly order blood tests and X-rays to help determine the seriousness of your pain. However, in many cases, more time must pass before a clear cause of the pain can be found. Before that point, your health care provider may not know if you need more testing or further treatment. HOME CARE INSTRUCTIONS  Monitor your abdominal pain for any changes. The following actions may help to alleviate any discomfort you are experiencing:  Only take over-the-counter or prescription medicines as directed by your health care provider.  Do not take laxatives unless directed to do so  by your health care provider.  Try a clear liquid diet (broth, tea, or water) as directed by your health care provider. Slowly move to a bland diet as tolerated. SEEK MEDICAL CARE IF:  You have unexplained abdominal pain.  You have abdominal pain associated with nausea or diarrhea.  You have pain when you urinate or have a bowel movement.  You experience abdominal pain that wakes you in the night.  You have abdominal pain that is worsened or improved by eating food.  You have abdominal pain that is worsened with eating fatty foods.  You have a fever. SEEK IMMEDIATE MEDICAL CARE IF:   Your pain does not go away within 2 hours.  You keep throwing up (vomiting).  Your pain is felt only in portions of the abdomen, such as the right side or the left lower portion of the abdomen.  You pass bloody or black tarry stools. MAKE SURE YOU:  Understand these instructions.   Will watch your condition.   Will get help right away if you are not doing well or get worse.  Document Released: 04/13/2005 Document Revised: 07/09/2013 Document Reviewed: 03/13/2013 [facility] Patient Information 2015 [place], [place]. This information is not intended to replace advice given to you by your health care provider. Make sure you discuss any questions you have with your health care provider.  Gastroenteritis:  Diarrhea Infections caused by germs (bacterial) or a virus commonly cause diarrhea. Your caregiver has determined that with time, rest and fluids, the diarrhea should improve. In general, eat normally while drinking more water than usual. Although water may prevent dehydration, it does not contain salt and minerals (electrolytes). Broths, weak tea without caffeine and oral rehydration solutions (ORS) replace fluids and electrolytes. Small amounts of fluids should be taken frequently. Large amounts at one time may not be tolerated. Plain water may be harmful in infants and the elderly. Oral  rehydrating solutions (ORS) are available at pharmacies and grocery stores. ORS replace water and important electrolytes in proper proportions. Sports drinks are not as effective as ORS and may be harmful due to sugars worsening diarrhea.  ORS is especially recommended for use in children with diarrhea. As a general guideline for children, replace any new fluid losses from diarrhea and/or vomiting with ORS as follows:   If your child weighs 22 pounds or under (10 kg or less), give 60-120 mL ( -  cup or 2 - 4 ounces) of ORS for each episode of diarrheal stool or vomiting episode.   If your child weighs more than 22 pounds (more than 10 kgs), give 120-240 mL ( - 1 cup or 4 - 8 ounces) of ORS for each diarrheal stool or episode of vomiting.   While correcting for dehydration, children should eat normally. However, foods high in sugar should be avoided because this may worsen diarrhea. Large amounts of carbonated soft  drinks, juice, gelatin desserts and other highly sugared drinks should be avoided.   After correction of dehydration, other liquids that are appealing to the child may be added. Children should drink small amounts of fluids frequently and fluids should be increased as tolerated. Children should drink enough fluids to keep urine clear or pale yellow.   Adults should eat normally while drinking more fluids than usual. Drink small amounts of fluids frequently and increase as tolerated. Drink enough fluids to keep urine clear or pale yellow. Broths, weak decaffeinated tea, lemon lime soft drinks (allowed to go flat) and ORS replace fluids and electrolytes.   Avoid:   Carbonated drinks.   Juice.   Extremely hot or cold fluids.   Caffeine drinks.   Fatty, greasy foods.   Alcohol.   Tobacco.   Too much intake of anything at one time.   Gelatin desserts.   Probiotics are active cultures of beneficial bacteria. They may lessen the amount and number of diarrheal stools in adults.  Probiotics can be found in yogurt with active cultures and in supplements.   Wash hands well to avoid spreading bacteria and virus.   Anti-diarrheal medications are not recommended for infants and children.   Only take over-the-counter or prescription medicines for pain, discomfort or fever as directed by your caregiver. Do not give aspirin to children because it may cause Reye's Syndrome.   For adults, ask your caregiver if you should continue all prescribed and over-the-counter medicines.   If your caregiver has given you a follow-up appointment, it is very important to keep that appointment. Not keeping the appointment could result in a chronic or permanent injury, and disability. If there is any problem keeping the appointment, you must call back to this facility for assistance.  SEEK IMMEDIATE MEDICAL CARE IF:   You or your child is unable to keep fluids down or other symptoms or problems become worse in spite of treatment.   Vomiting or diarrhea develops and becomes persistent.   There is vomiting of blood or bile (green material).   There is blood in the stool or the stools are black and tarry.   There is no urine output in 6-8 hours or there is only a small amount of very dark urine.   Abdominal pain develops, increases or localizes.   You have a fever.   Your baby is older than 3 months with a rectal temperature of 102 F (38.9 C) or higher.   Your baby is 7 months old or younger with a rectal temperature of 100.4 F (38 C) or higher.   You or your child develops excessive weakness, dizziness, fainting or extreme thirst.   You or your child develops a rash, stiff neck, severe headache or become irritable or sleepy and difficult to awaken.  MAKE SURE YOU:   Understand these instructions.   Will watch your condition.   Will get help right away if you are not doing well or get worse.  Document Released: 06/24/2002 Document Revised: 06/23/2011 Document Reviewed:  05/11/2009 [facility] Patient Information 2012 [place].  Nausea and Vomiting Nausea is a sick feeling that often comes before throwing up (vomiting). Vomiting is a reflex where stomach contents come out of your mouth. Vomiting can cause severe loss of body fluids (dehydration). Children and elderly adults can become dehydrated quickly, especially if they also have diarrhea. Nausea and vomiting are symptoms of a condition or disease. It is important to find the cause of your symptoms. CAUSES  Direct irritation of the stomach lining. This irritation can result from increased acid production (gastroesophageal reflux disease), infection, food poisoning, taking certain medicines (such as nonsteroidal anti-inflammatory drugs), alcohol use, or tobacco use.   Signals from the brain.These signals could be caused by a headache, heat exposure, an inner ear disturbance, increased pressure in the brain from injury, infection, a tumor, or a concussion, pain, emotional stimulus, or metabolic problems.   An obstruction in the gastrointestinal tract (bowel obstruction).   Illnesses such as diabetes, hepatitis, gallbladder problems, appendicitis, kidney problems, cancer, sepsis, atypical symptoms of a heart attack, or eating disorders.   Medical treatments such as chemotherapy and radiation.   Receiving medicine that makes you sleep (general anesthetic) during surgery.  DIAGNOSIS Your caregiver may ask for tests to be done if the problems do not improve after a few days. Tests may also be done if symptoms are severe or if the reason for the nausea and vomiting is not clear. Tests may include:  Urine tests.   Blood tests.   Stool tests.   Cultures (to look for evidence of infection).   X-rays or other imaging studies.  Test results can help your caregiver make decisions about treatment or the need for additional tests. TREATMENT You need to stay well hydrated. Drink frequently but in small  amounts.You may wish to drink water, sports drinks, clear broth, or eat frozen ice pops or gelatin dessert to help stay hydrated.When you eat, eating slowly may help prevent nausea.There are also some antinausea medicines that may help prevent nausea. HOME CARE INSTRUCTIONS   Take all medicine as directed by your caregiver.   If you do not have an appetite, do not force yourself to eat. However, you must continue to drink fluids.   If you have an appetite, eat a normal diet unless your caregiver tells you differently.   Eat a variety of complex carbohydrates (rice, wheat, potatoes, bread), lean meats, yogurt, fruits, and vegetables.   Avoid high-fat foods because they are more difficult to digest.   Drink enough water and fluids to keep your urine clear or pale yellow.   If you are dehydrated, ask your caregiver for specific rehydration instructions. Signs of dehydration may include:   Severe thirst.   Dry lips and mouth.   Dizziness.   Dark urine.   Decreasing urine frequency and amount.   Confusion.   Rapid breathing or pulse.  SEEK IMMEDIATE MEDICAL CARE IF:   You have blood or brown flecks (like coffee grounds) in your vomit.   You have black or bloody stools.   You have a severe headache or stiff neck.   You are confused.   You have severe abdominal pain.   You have chest pain or trouble breathing.   You do not urinate at least once every 8 hours.   You develop cold or clammy skin.   You continue to vomit for longer than 24 to 48 hours.   You have a fever.  MAKE SURE YOU:   Understand these instructions.   Will watch your condition.   Will get help right away if you are not doing well or get worse.  Document Released: 07/04/2005 Document Revised: 06/23/2011 Document Reviewed: 12/01/2010 [facility] Patient Information 2012 [place], [place].  Return to the clinic or go to the nearest emergency room if any of your symptoms worsen or new symptoms  occur.  Chest Pain (Nonspecific) It is often hard to give a specific diagnosis for the cause of  chest pain. There is always a chance that your pain could be related to something serious, such as a heart attack or a blood clot in the lungs. You need to follow up with your health care provider for further evaluation. CAUSES  Heartburn. Pneumonia or bronchitis. Anxiety or stress. Inflammation around your heart (pericarditis) or lung (pleuritis or pleurisy). A blood clot in the lung. A collapsed lung (pneumothorax). It can develop suddenly on its own (spontaneous pneumothorax) or from trauma to the chest. Shingles infection (herpes zoster virus). The chest wall is composed of bones, muscles, and cartilage. Any of these can be the source of the pain. The bones can be bruised by injury. The muscles or cartilage can be strained by coughing or overwork. The cartilage can be affected by inflammation and become sore (costochondritis). DIAGNOSIS  Lab tests or other studies may be needed to find the cause of your pain. Your health care provider may have you take a test called an ambulatory electrocardiogram (ECG). An ECG records your heartbeat patterns over a 24-hour period. You may also have other tests, such as: Transthoracic echocardiogram (TTE). During echocardiography, sound waves are used to evaluate how blood flows through your heart. Transesophageal echocardiogram (TEE). Cardiac monitoring. This allows your health care provider to monitor your heart rate and rhythm in real time. Holter monitor. This is a portable device that records your heartbeat and can help diagnose heart arrhythmias. It allows your health care provider to track your heart activity for several days, if needed. Stress tests by exercise or by giving medicine that makes the heart beat faster. TREATMENT  Treatment depends on what may be causing your chest pain. Treatment may include: Acid blockers for  heartburn. Anti-inflammatory medicine. Pain medicine for inflammatory conditions. Antibiotics if an infection is present. You may be advised to change lifestyle habits. This includes stopping smoking and avoiding alcohol, caffeine, and chocolate. You may be advised to keep your head raised (elevated) when sleeping. This reduces the chance of acid going backward from your stomach into your esophagus. Most of the time, nonspecific chest pain will improve within 2-3 days with rest and mild pain medicine.  HOME CARE INSTRUCTIONS  If antibiotics were prescribed, take them as directed. Finish them even if you start to feel better. For the next few days, avoid physical activities that bring on chest pain. Continue physical activities as directed. Do not use any tobacco products, including cigarettes, chewing tobacco, or electronic cigarettes. Avoid drinking alcohol. Only take medicine as directed by your health care provider. Follow your health care provider's suggestions for further testing if your chest pain does not go away. Keep any follow-up appointments you made. If you do not go to an appointment, you could develop lasting (chronic) problems with pain. If there is any problem keeping an appointment, call to reschedule. SEEK MEDICAL CARE IF:  Your chest pain does not go away, even after treatment. You have a rash with blisters on your chest. You have a fever. SEEK IMMEDIATE MEDICAL CARE IF:  You have increased chest pain or pain that spreads to your arm, neck, jaw, back, or abdomen. You have shortness of breath. You have an increasing cough, or you cough up blood. You have severe back or abdominal pain. You feel nauseous or vomit. You have severe weakness. You faint. You have chills. This is an emergency. Do not wait to see if the pain will go away. Get medical help at once. Call your local emergency services (911 in [place]  Do not drive yourself to the hospital. MAKE SURE YOU:   Understand these instructions. Will watch your condition. Will get help right away if you are not doing well or get worse. Document Released: 04/13/2005 Document Revised: 07/09/2013 Document Reviewed: 02/07/2008 [facility] Patient Information 2015 [place], [place]. This information is not intended to replace advice given to you by your health care provider. Make sure you discuss any questions you have with your health care provider.     I personally performed the services described in this documentation, which was scribed in my presence. The recorded information has been reviewed and considered, and addended by me as needed.

## 2015-01-01 NOTE — Patient Instructions (Signed)
Cut back on gum or mints with sorbitol or xylitol as this substance has been associated with increased gas/bloating and diarrhea. You may also have a flair of IBS or virus.  If needed for nausea and vomiting - you can take Zofran up to every 8 hours (but this can cause constipation, on only use if needed).  Fluids, bland foods next few days. You should receive a call or letter about your lab results within the next week to 10 days. I will refer you to gastroenterologist, but if abdominal symptoms worsening, fever, or other new/worsening symptoms - return here or emergency room.  You should receive a call or letter about your lab results within the next week to 10 days.   Your EKG is slightly abnormal. Avoid exertional exercise for now and we will try to have you evaluated sooner with cardiology. If any return of chest pain, palpitations, short of breath or worsening - be seen in emergency room or call 911.   Abdominal Pain Many things can cause abdominal pain. Usually, abdominal pain is not caused by a disease and will improve without treatment. It can often be observed and treated at home. Your health care provider will do a physical exam and possibly order blood tests and X-rays to help determine the seriousness of your pain. However, in many cases, more time must pass before a clear cause of the pain can be found. Before that point, your health care provider may not know if you need more testing or further treatment. HOME CARE INSTRUCTIONS  Monitor your abdominal pain for any changes. The following actions may help to alleviate any discomfort you are experiencing:  Only take over-the-counter or prescription medicines as directed by your health care provider.  Do not take laxatives unless directed to do so by your health care provider.  Try a clear liquid diet (broth, tea, or water) as directed by your health care provider. Slowly move to a bland diet as tolerated. SEEK MEDICAL CARE IF:  You  have unexplained abdominal pain.  You have abdominal pain associated with nausea or diarrhea.  You have pain when you urinate or have a bowel movement.  You experience abdominal pain that wakes you in the night.  You have abdominal pain that is worsened or improved by eating food.  You have abdominal pain that is worsened with eating fatty foods.  You have a fever. SEEK IMMEDIATE MEDICAL CARE IF:   Your pain does not go away within 2 hours.  You keep throwing up (vomiting).  Your pain is felt only in portions of the abdomen, such as the right side or the left lower portion of the abdomen.  You pass bloody or black tarry stools. MAKE SURE YOU:  Understand these instructions.   Will watch your condition.   Will get help right away if you are not doing well or get worse.  Document Released: 04/13/2005 Document Revised: 07/09/2013 Document Reviewed: 03/13/2013 [facility] Patient Information 2015 [place], [place]. This information is not intended to replace advice given to you by your health care provider. Make sure you discuss any questions you have with your health care provider.  Gastroenteritis:  Diarrhea Infections caused by germs (bacterial) or a virus commonly cause diarrhea. Your caregiver has determined that with time, rest and fluids, the diarrhea should improve. In general, eat normally while drinking more water than usual. Although water may prevent dehydration, it does not contain salt and minerals (electrolytes). Broths, weak tea without caffeine and oral rehydration  solutions (ORS) replace fluids and electrolytes. Small amounts of fluids should be taken frequently. Large amounts at one time may not be tolerated. Plain water may be harmful in infants and the elderly. Oral rehydrating solutions (ORS) are available at pharmacies and grocery stores. ORS replace water and important electrolytes in proper proportions. Sports drinks are not as effective as ORS and may be  harmful due to sugars worsening diarrhea.  ORS is especially recommended for use in children with diarrhea. As a general guideline for children, replace any new fluid losses from diarrhea and/or vomiting with ORS as follows:   If your child weighs 22 pounds or under (10 kg or less), give 60-120 mL ( -  cup or 2 - 4 ounces) of ORS for each episode of diarrheal stool or vomiting episode.   If your child weighs more than 22 pounds (more than 10 kgs), give 120-240 mL ( - 1 cup or 4 - 8 ounces) of ORS for each diarrheal stool or episode of vomiting.   While correcting for dehydration, children should eat normally. However, foods high in sugar should be avoided because this may worsen diarrhea. Large amounts of carbonated soft drinks, juice, gelatin desserts and other highly sugared drinks should be avoided.   After correction of dehydration, other liquids that are appealing to the child may be added. Children should drink small amounts of fluids frequently and fluids should be increased as tolerated. Children should drink enough fluids to keep urine clear or pale yellow.   Adults should eat normally while drinking more fluids than usual. Drink small amounts of fluids frequently and increase as tolerated. Drink enough fluids to keep urine clear or pale yellow. Broths, weak decaffeinated tea, lemon lime soft drinks (allowed to go flat) and ORS replace fluids and electrolytes.   Avoid:   Carbonated drinks.   Juice.   Extremely hot or cold fluids.   Caffeine drinks.   Fatty, greasy foods.   Alcohol.   Tobacco.   Too much intake of anything at one time.   Gelatin desserts.   Probiotics are active cultures of beneficial bacteria. They may lessen the amount and number of diarrheal stools in adults. Probiotics can be found in yogurt with active cultures and in supplements.   Wash hands well to avoid spreading bacteria and virus.   Anti-diarrheal medications are not recommended for infants  and children.   Only take over-the-counter or prescription medicines for pain, discomfort or fever as directed by your caregiver. Do not give aspirin to children because it may cause Reye's Syndrome.   For adults, ask your caregiver if you should continue all prescribed and over-the-counter medicines.   If your caregiver has given you a follow-up appointment, it is very important to keep that appointment. Not keeping the appointment could result in a chronic or permanent injury, and disability. If there is any problem keeping the appointment, you must call back to this facility for assistance.  SEEK IMMEDIATE MEDICAL CARE IF:   You or your child is unable to keep fluids down or other symptoms or problems become worse in spite of treatment.   Vomiting or diarrhea develops and becomes persistent.   There is vomiting of blood or bile (green material).   There is blood in the stool or the stools are black and tarry.   There is no urine output in 6-8 hours or there is only a small amount of very dark urine.   Abdominal pain develops, increases or localizes.  You have a fever.   Your baby is older than 3 months with a rectal temperature of 102 F (38.9 C) or higher.   Your baby is 73 months old or younger with a rectal temperature of 100.4 F (38 C) or higher.   You or your child develops excessive weakness, dizziness, fainting or extreme thirst.   You or your child develops a rash, stiff neck, severe headache or become irritable or sleepy and difficult to awaken.  MAKE SURE YOU:   Understand these instructions.   Will watch your condition.   Will get help right away if you are not doing well or get worse.  Document Released: 06/24/2002 Document Revised: 06/23/2011 Document Reviewed: 05/11/2009 [facility] Patient Information 2012 [place], [place].  Nausea and Vomiting Nausea is a sick feeling that often comes before throwing up (vomiting). Vomiting is a reflex where stomach contents  come out of your mouth. Vomiting can cause severe loss of body fluids (dehydration). Children and elderly adults can become dehydrated quickly, especially if they also have diarrhea. Nausea and vomiting are symptoms of a condition or disease. It is important to find the cause of your symptoms. CAUSES   Direct irritation of the stomach lining. This irritation can result from increased acid production (gastroesophageal reflux disease), infection, food poisoning, taking certain medicines (such as nonsteroidal anti-inflammatory drugs), alcohol use, or tobacco use.   Signals from the brain.These signals could be caused by a headache, heat exposure, an inner ear disturbance, increased pressure in the brain from injury, infection, a tumor, or a concussion, pain, emotional stimulus, or metabolic problems.   An obstruction in the gastrointestinal tract (bowel obstruction).   Illnesses such as diabetes, hepatitis, gallbladder problems, appendicitis, kidney problems, cancer, sepsis, atypical symptoms of a heart attack, or eating disorders.   Medical treatments such as chemotherapy and radiation.   Receiving medicine that makes you sleep (general anesthetic) during surgery.  DIAGNOSIS Your caregiver may ask for tests to be done if the problems do not improve after a few days. Tests may also be done if symptoms are severe or if the reason for the nausea and vomiting is not clear. Tests may include:  Urine tests.   Blood tests.   Stool tests.   Cultures (to look for evidence of infection).   X-rays or other imaging studies.  Test results can help your caregiver make decisions about treatment or the need for additional tests. TREATMENT You need to stay well hydrated. Drink frequently but in small amounts.You may wish to drink water, sports drinks, clear broth, or eat frozen ice pops or gelatin dessert to help stay hydrated.When you eat, eating slowly may help prevent nausea.There are also some  antinausea medicines that may help prevent nausea. HOME CARE INSTRUCTIONS   Take all medicine as directed by your caregiver.   If you do not have an appetite, do not force yourself to eat. However, you must continue to drink fluids.   If you have an appetite, eat a normal diet unless your caregiver tells you differently.   Eat a variety of complex carbohydrates (rice, wheat, potatoes, bread), lean meats, yogurt, fruits, and vegetables.   Avoid high-fat foods because they are more difficult to digest.   Drink enough water and fluids to keep your urine clear or pale yellow.   If you are dehydrated, ask your caregiver for specific rehydration instructions. Signs of dehydration may include:   Severe thirst.   Dry lips and mouth.   Dizziness.  Dark urine.   Decreasing urine frequency and amount.   Confusion.   Rapid breathing or pulse.  SEEK IMMEDIATE MEDICAL CARE IF:   You have blood or brown flecks (like coffee grounds) in your vomit.   You have black or bloody stools.   You have a severe headache or stiff neck.   You are confused.   You have severe abdominal pain.   You have chest pain or trouble breathing.   You do not urinate at least once every 8 hours.   You develop cold or clammy skin.   You continue to vomit for longer than 24 to 48 hours.   You have a fever.  MAKE SURE YOU:   Understand these instructions.   Will watch your condition.   Will get help right away if you are not doing well or get worse.  Document Released: 07/04/2005 Document Revised: 06/23/2011 Document Reviewed: 12/01/2010 [facility] Patient Information 2012 [place], [place].  Return to the clinic or go to the nearest emergency room if any of your symptoms worsen or new symptoms occur.  Chest Pain (Nonspecific) It is often hard to give a specific diagnosis for the cause of chest pain. There is always a chance that your pain could be related to something serious, such as a heart attack  or a blood clot in the lungs. You need to follow up with your health care provider for further evaluation. CAUSES  Heartburn. Pneumonia or bronchitis. Anxiety or stress. Inflammation around your heart (pericarditis) or lung (pleuritis or pleurisy). A blood clot in the lung. A collapsed lung (pneumothorax). It can develop suddenly on its own (spontaneous pneumothorax) or from trauma to the chest. Shingles infection (herpes zoster virus). The chest wall is composed of bones, muscles, and cartilage. Any of these can be the source of the pain. The bones can be bruised by injury. The muscles or cartilage can be strained by coughing or overwork. The cartilage can be affected by inflammation and become sore (costochondritis). DIAGNOSIS  Lab tests or other studies may be needed to find the cause of your pain. Your health care provider may have you take a test called an ambulatory electrocardiogram (ECG). An ECG records your heartbeat patterns over a 24-hour period. You may also have other tests, such as: Transthoracic echocardiogram (TTE). During echocardiography, sound waves are used to evaluate how blood flows through your heart. Transesophageal echocardiogram (TEE). Cardiac monitoring. This allows your health care provider to monitor your heart rate and rhythm in real time. Holter monitor. This is a portable device that records your heartbeat and can help diagnose heart arrhythmias. It allows your health care provider to track your heart activity for several days, if needed. Stress tests by exercise or by giving medicine that makes the heart beat faster. TREATMENT  Treatment depends on what may be causing your chest pain. Treatment may include: Acid blockers for heartburn. Anti-inflammatory medicine. Pain medicine for inflammatory conditions. Antibiotics if an infection is present. You may be advised to change lifestyle habits. This includes stopping smoking and avoiding alcohol, caffeine, and  chocolate. You may be advised to keep your head raised (elevated) when sleeping. This reduces the chance of acid going backward from your stomach into your esophagus. Most of the time, nonspecific chest pain will improve within 2-3 days with rest and mild pain medicine.  HOME CARE INSTRUCTIONS  If antibiotics were prescribed, take them as directed. Finish them even if you start to feel better. For the next few days, avoid  physical activities that bring on chest pain. Continue physical activities as directed. Do not use any tobacco products, including cigarettes, chewing tobacco, or electronic cigarettes. Avoid drinking alcohol. Only take medicine as directed by your health care provider. Follow your health care provider's suggestions for further testing if your chest pain does not go away. Keep any follow-up appointments you made. If you do not go to an appointment, you could develop lasting (chronic) problems with pain. If there is any problem keeping an appointment, call to reschedule. SEEK MEDICAL CARE IF:  Your chest pain does not go away, even after treatment. You have a rash with blisters on your chest. You have a fever. SEEK IMMEDIATE MEDICAL CARE IF:  You have increased chest pain or pain that spreads to your arm, neck, jaw, back, or abdomen. You have shortness of breath. You have an increasing cough, or you cough up blood. You have severe back or abdominal pain. You feel nauseous or vomit. You have severe weakness. You faint. You have chills. This is an emergency. Do not wait to see if the pain will go away. Get medical help at once. Call your local emergency services (911 in [place] Do not drive yourself to the hospital. MAKE SURE YOU:  Understand these instructions. Will watch your condition. Will get help right away if you are not doing well or get worse. Document Released: 04/13/2005 Document Revised: 07/09/2013 Document Reviewed: 02/07/2008 [facility] Patient Information  2015 [place], [place]. This information is not intended to replace advice given to you by your health care provider. Make sure you discuss any questions you have with your health care provider.

## 2015-01-12 ENCOUNTER — Encounter: Payer: Self-pay | Admitting: Internal Medicine

## 2015-01-12 ENCOUNTER — Ambulatory Visit (INDEPENDENT_AMBULATORY_CARE_PROVIDER_SITE_OTHER): Payer: BC Managed Care – PPO | Admitting: Internal Medicine

## 2015-01-12 VITALS — BP 156/88 | HR 66 | Ht 66.0 in | Wt 153.9 lb

## 2015-01-12 DIAGNOSIS — R9431 Abnormal electrocardiogram [ECG] [EKG]: Secondary | ICD-10-CM

## 2015-01-12 DIAGNOSIS — R079 Chest pain, unspecified: Secondary | ICD-10-CM | POA: Diagnosis not present

## 2015-01-12 DIAGNOSIS — E785 Hyperlipidemia, unspecified: Secondary | ICD-10-CM

## 2015-01-12 DIAGNOSIS — F419 Anxiety disorder, unspecified: Secondary | ICD-10-CM

## 2015-01-12 DIAGNOSIS — I10 Essential (primary) hypertension: Secondary | ICD-10-CM | POA: Diagnosis not present

## 2015-01-12 DIAGNOSIS — R002 Palpitations: Secondary | ICD-10-CM | POA: Diagnosis not present

## 2015-01-12 NOTE — Patient Instructions (Signed)
Dr. Rennis Golden has ordered an exercise tolerance test  Exercise Stress Electrocardiogram An exercise stress electrocardiogram is a test that is done to evaluate the blood supply to your heart. This test may also be called exercise stress electrocardiography. The test is done while you are walking on a treadmill. The goal of this test is to raise your heart rate. This test is done to find areas of poor blood flow to the heart by determining the extent of coronary artery disease (CAD).   CAD is defined as narrowing in one or more heart (coronary) arteries of more than 70%. If you have an abnormal test result, this may mean that you are not getting adequate blood flow to your heart during exercise. Additional testing may be needed to understand why your test was abnormal. LET [facility] CARE PROVIDER KNOW ABOUT:   Any allergies you have.  All medicines you are taking, including vitamins, herbs, eye drops, creams, and over-the-counter medicines.  Previous problems you or members of your family have had with the use of anesthetics.  Any blood disorders you have.  Previous surgeries you have had.  Medical conditions you have.  Possibility of pregnancy, if this applies. RISKS AND COMPLICATIONS Generally, this is a safe procedure. However, as with any procedure, complications can occur. Possible complications can include:  Pain or pressure in the following areas:  Chest.  Jaw or neck.  Between your shoulder blades.  Radiating down your left arm.  Dizziness or light-headedness.  Shortness of breath.  Increased or irregular heartbeats.  Nausea or vomiting.  Heart attack (rare). BEFORE THE PROCEDURE  Avoid all forms of caffeine 24 hours before your test or as directed by your health care provider. This includes coffee, tea (even decaffeinated tea), caffeinated sodas, chocolate, cocoa, and certain pain medicines.  Follow your health care provider's instructions regarding eating and  drinking before the test.  Take your medicines as directed at regular times with water unless instructed otherwise. Exceptions may include:  If you have diabetes, ask how you are to take your insulin or pills. It is common to adjust insulin dosing the morning of the test.  If you are taking beta-blocker medicines, it is important to talk to your health care provider about these medicines well before the date of your test. Taking beta-blocker medicines may interfere with the test. In some cases, these medicines need to be changed or stopped 24 hours or more before the test.  If you wear a nitroglycerin patch, it may need to be removed prior to the test. Ask your health care provider if the patch should be removed before the test.  If you use an inhaler for any breathing condition, bring it with you to the test.  If you are an outpatient, bring a snack so you can eat right after the stress phase of the test.  Do not smoke for 4 hours prior to the test or as directed by your health care provider.  Do not apply lotions, powders, creams, or oils on your chest prior to the test.  Wear loose-fitting clothes and comfortable shoes for the test. This test involves walking on a treadmill. PROCEDURE  Multiple patches (electrodes) will be put on your chest. If needed, small areas of your chest may have to be shaved to get better contact with the electrodes. Once the electrodes are attached to your body, multiple wires will be attached to the electrodes and your heart rate will be monitored.  Your heart will be  monitored both at rest and while exercising.  You will walk on a treadmill. The treadmill will be started at a slow pace. The treadmill speed and incline will gradually be increased to raise your heart rate. AFTER THE PROCEDURE  Your heart rate and blood pressure will be monitored after the test.  You may return to your normal schedule including diet, activities, and medicines, unless your  health care provider tells you otherwise. Document Released: 07/01/2000 Document Revised: 07/09/2013 Document Reviewed: 03/11/2013 [facility] Patient Information 2015 [place]. This information is not intended to replace advice given to you by your health care provider. Make sure you discuss any questions you have with your health care provider.   Please call us if you wish to schedule this - otherwise follow up as needed.

## 2015-01-14 ENCOUNTER — Telehealth (HOSPITAL_COMMUNITY): Payer: Self-pay | Admitting: *Deleted

## 2015-01-14 NOTE — Progress Notes (Signed)
OFFICE NOTE  Chief Complaint:  Palpitations  Primary Care Physician: Amanda PearsonOLITTLE, Amanda P, MD  HPI:  Amanda Swanson is a pleasant 64 year old female is coming referred to me for evaluation of palpitations. She's been seen in the past for hypertension by Amanda ArntMike Durand, [facility]C [facility]. Recently she's been having some palpitations. She was noted to have reflux symptoms and was placed on pantoprazole although she feels that this caused her lipid pressure to drop. Since then she's been taking ranitidine 150 mg twice daily. Her family history significant for father died of heart attack as well as diabetes and hypertension in the family.  PMHx:  Past Medical History  Diagnosis Date  . Allergy   . Cataract   . Heart murmur   . Hypertension   . Anxiety   . IBS (irritable bowel syndrome)     Past Surgical History  Procedure Laterality Date  . Breast surgery      FAMHx:  Family History  Problem Relation Age of Onset  . Heart disease Father   . Cancer Sister     breast  . Diabetes Sister   . Diabetes Brother   . Hypertension Brother   . Cancer Mother   . Diabetes Brother     SOCHx:   reports that she has never smoked. She has never used smokeless tobacco. She reports that she does not drink alcohol or use illicit drugs.  ALLERGIES:  Allergies  Allergen Reactions  . Other Swelling    All Nuts  . Shellfish Allergy     Allergy determined by allergy test - patient does not eat    ROS: A comprehensive review of systems was negative except for: Cardiovascular: positive for palpitations  HOME MEDS: Current Outpatient Prescriptions  Medication Sig Dispense Refill  . clonazePAM (KLONOPIN) 0.5 MG tablet Take 1 tablet (0.5 mg total) by mouth 2 (two) times daily as needed for anxiety. Takes 1/2 pill prn. 60 tablet 0  . Multiple Vitamin (MULTIVITAMIN) tablet Take by mouth daily. Takes 1/2 daily.    . nebivolol (BYSTOLIC) 10 MG tablet Take 5  mg by mouth daily.    . ranitidine (ZANTAC) 150 MG tablet Take 150 mg by mouth 2 (two) times daily.     No current facility-administered medications for this visit.    LABS/IMAGING: No results found for this or any previous visit (from the past 48 hour(s)). No results found.  WEIGHTS: Wt Readings from Last 3 Encounters:  01/12/15 153 lb 14.4 oz (69.809 kg)  01/01/15 156 lb 9.6 oz (71.033 kg)  12/13/14 158 lb (71.668 kg)    VITALS: BP 156/88 mmHg  Pulse 66  Ht 5\' 6"  (1.676 m)  Wt 153 lb 14.4 oz (69.809 kg)  BMI 24.85 kg/m2  EXAM: General appearance: alert and no distress Neck: no carotid bruit and no JVD Lungs: clear to auscultation bilaterally Heart: regular rate and rhythm, S1, S2 normal, no murmur, click, rub or gallop Abdomen: soft, non-tender; bowel sounds normal; no masses,  no organomegaly Extremities: extremities normal, atraumatic, no cyanosis or edema Pulses: 2+ and symmetric Skin: Skin color, texture, turgor normal. No rashes or lesions Neurologic: Grossly normal Psych: Pleasant  EKG: I reviewed an EKG from her primary care provider's office indicating sinus rhythm at 80  ASSESSMENT: 1. Palpitations 2. History of hypertension 3. Question mitral valve prolapse  PLAN: 1.   Mrs. Amanda Swanson recently has been having some palpitations which seem to have improved. She also  had an unexpected blood pressure drop. This could represent ischemia. I'm recommending an exercise treadmill stress test to evaluate it. Plan to see her back in a few weeks to discuss those results.  Ask for the kind referral.  Chrystie Nose, MD, [facility] Attending Cardiologist [facility]  Chrystie Nose 01/14/2015, 8:40 PM

## 2015-01-15 ENCOUNTER — Encounter: Payer: Self-pay | Admitting: *Deleted

## 2015-01-21 ENCOUNTER — Telehealth (HOSPITAL_COMMUNITY): Payer: Self-pay | Admitting: *Deleted

## 2015-01-21 NOTE — Telephone Encounter (Signed)
Patient was instructed to call if she wished to have test. Appears she does not want to schedule. Will inform MD

## 2015-01-21 NOTE — Telephone Encounter (Signed)
The stress test is to look for a blocked artery which may explain her low blood pressure recently as well as palpitaitons.  Dr. HRexene Edison

## 2015-01-21 NOTE — Telephone Encounter (Signed)
FYI.Marland Kitchen.Marland Kitchen.Marland Kitchen.Marland Kitchen.Pt does not want to schedule the stress test at this time. She does not understand why she needs to have one done.

## 2015-01-26 NOTE — Telephone Encounter (Signed)
LMTCB

## 2015-01-26 NOTE — Telephone Encounter (Signed)
Spoke with patient - explained stress test a little more and what it may can tell us regarding her symptoms. She would like to schedule this. Staff message sent to WilburEbony.

## 2015-02-18 ENCOUNTER — Telehealth (HOSPITAL_COMMUNITY): Payer: Self-pay

## 2015-02-18 NOTE — Telephone Encounter (Signed)
Encounter complete. 

## 2015-02-19 ENCOUNTER — Telehealth (HOSPITAL_COMMUNITY): Payer: Self-pay

## 2015-02-19 NOTE — Telephone Encounter (Signed)
Encounter complete. 

## 2015-02-20 ENCOUNTER — Ambulatory Visit (HOSPITAL_COMMUNITY)
Admission: RE | Admit: 2015-02-20 | Discharge: 2015-02-20 | Disposition: A | Discharge status: Home / Self Care | Payer: BC Managed Care – PPO | Source: Ambulatory Visit | Attending: Internal Medicine | Admitting: Internal Medicine

## 2015-02-20 DIAGNOSIS — R079 Chest pain, unspecified: Secondary | ICD-10-CM | POA: Diagnosis not present

## 2015-02-20 DIAGNOSIS — R9431 Abnormal electrocardiogram [ECG] [EKG]: Secondary | ICD-10-CM | POA: Diagnosis not present

## 2015-02-20 DIAGNOSIS — R002 Palpitations: Secondary | ICD-10-CM | POA: Diagnosis not present

## 2015-02-20 LAB — EXERCISE TOLERANCE TEST
CSEPED: 10 min
CSEPEW: 10.4 METS
CSEPHR: 92 %
MPHR: 157 {beats}/min
Peak HR: 146 {beats}/min
RPE: 15
Rest HR: 104 {beats}/min

## 2015-03-06 ENCOUNTER — Ambulatory Visit: Payer: BC Managed Care – PPO | Admitting: Interventional Cardiology

## 2015-04-08 ENCOUNTER — Ambulatory Visit (INDEPENDENT_AMBULATORY_CARE_PROVIDER_SITE_OTHER): Payer: BC Managed Care – PPO | Admitting: Emergency Medicine

## 2015-04-08 ENCOUNTER — Ambulatory Visit (INDEPENDENT_AMBULATORY_CARE_PROVIDER_SITE_OTHER): Payer: BC Managed Care – PPO

## 2015-04-08 VITALS — BP 142/92 | HR 91 | Temp 98.7°F | Resp 17 | Ht 67.0 in | Wt 156.0 lb

## 2015-04-08 DIAGNOSIS — R05 Cough: Secondary | ICD-10-CM | POA: Diagnosis not present

## 2015-04-08 DIAGNOSIS — J309 Allergic rhinitis, unspecified: Secondary | ICD-10-CM

## 2015-04-08 DIAGNOSIS — R059 Cough, unspecified: Secondary | ICD-10-CM

## 2015-04-08 LAB — POCT CBC
GRANULOCYTE PERCENT: 68.5 % (ref 37–80)
HEMATOCRIT: 42.9 % (ref 37.7–47.9)
HEMOGLOBIN: 13.9 g/dL (ref 12.2–16.2)
Lymph, poc: 2.2 (ref 0.6–3.4)
MCH, POC: 26.3 pg — AB (ref 27–31.2)
MCHC: 32.4 g/dL (ref 31.8–35.4)
MCV: 81.2 fL (ref 80–97)
MID (cbc): 0.9 (ref 0–0.9)
MPV: 10 fL (ref 0–99.8)
POC GRANULOCYTE: 6.8 (ref 2–6.9)
POC LYMPH PERCENT: 22.5 %L (ref 10–50)
POC MID %: 9 %M (ref 0–12)
Platelet Count, POC: 193 10*3/uL (ref 142–424)
RBC: 5.28 M/uL (ref 4.04–5.48)
RDW, POC: 14.7 %
WBC: 9.9 10*3/uL (ref 4.6–10.2)

## 2015-04-08 MED ORDER — BECLOMETHASONE DIPROPIONATE 80 MCG/ACT IN AERS: 2.0000 | INHALATION_SPRAY | Freq: Two times a day (BID) | RESPIRATORY_TRACT | Status: DC

## 2015-04-08 MED ORDER — MONTELUKAST SODIUM 10 MG PO TABS: 10.0000 mg | ORAL_TABLET | Freq: Every day | ORAL | Status: DC

## 2015-04-08 NOTE — Patient Instructions (Signed)
Cough, Adult  A cough is a reflex that helps clear your throat and airways. It can help heal the body or may be a reaction to an irritated airway. A cough may only last 2 or 3 weeks (acute) or may last more than 8 weeks (chronic).  CAUSES Acute cough:  Viral or bacterial infections. Chronic cough:  Infections.  Allergies.  Asthma.  Post-nasal drip.  Smoking.  Heartburn or acid reflux.  Some medicines.  Chronic lung problems (COPD).  Cancer. SYMPTOMS   Cough.  Fever.  Chest pain.  Increased breathing rate.  High-pitched whistling sound when breathing (wheezing).  Colored mucus that you cough up (sputum). TREATMENT   A bacterial cough may be treated with antibiotic medicine.  A viral cough must run its course and will not respond to antibiotics.  Your caregiver may recommend other treatments if you have a chronic cough. HOME CARE INSTRUCTIONS   Only take over-the-counter or prescription medicines for pain, discomfort, or fever as directed by your caregiver. Use cough suppressants only as directed by your caregiver.  Use a cold steam vaporizer or humidifier in your bedroom or home to help loosen secretions.  Sleep in a semi-upright position if your cough is worse at night.  Rest as needed.  Stop smoking if you smoke. SEEK IMMEDIATE MEDICAL CARE IF:   You have pus in your sputum.  Your cough starts to worsen.  You cannot control your cough with suppressants and are losing sleep.  You begin coughing up blood.  You have difficulty breathing.  You develop pain which is getting worse or is uncontrolled with medicine.  You have a fever. MAKE SURE YOU:   Understand these instructions.  Will watch your condition.  Will get help right away if you are not doing well or get worse. Document Released: 12/31/2010 Document Revised: 09/26/2011 Document Reviewed: 12/31/2010 ExitCare Patient Information 2015 [employer]. This information is not intended  to replace advice given to you by your health care provider. Make sure you discuss any questions you have with your health care provider.  

## 2015-04-08 NOTE — Progress Notes (Addendum)
Subjective:  This chart was scribed for Earl Lites MD, by Veverly Fells, at [facility].  This patient was seen in room 9 and the patient's care was started at 4:29 PM.    Patient ID: Amanda Swanson, female    DOB: 06-Mar-1951, 64 y.o.   MRN: [id number] Chief Complaint  Patient presents with  . Cough  . Allergic Reaction  . Headache    HPI HPI Comments: Amanda Swanson is a 64 y.o. female who presents to the [facility] complaining of coughing, a headache and a low grade fever (99) onset five days ago. Patient also states that her eyes and ears were hurting this morning.   Patient recently went to a convention in [place] where people were wearing a lot of perfume which caused her allergies to act up.  She has not yet taken any medication to alleviate her symptoms. She is very hesitant to take any medication as it raises her blood pressure and she has "worked very hard to bring it to normal."  Patient has allergies to many different things and states that anytime she takes something, it gives her palpitations.  She has been to the allergist in the past who wanted to give her a steroid shot but she denied it as she was afraid it would only make her heart race.  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   Patient Active Problem List   Diagnosis Date Noted  . Palpitations 04/03/2013  . hyperlipidemia 02/10/2012  . Anxiety 08/26/2011  . HTN (hypertension) 08/26/2011   Past Medical History  Diagnosis Date  . Allergy   . Cataract   . Heart murmur   . Hypertension   . Anxiety   . IBS (irritable bowel syndrome)    Past Surgical History  Procedure Laterality Date  . Breast surgery     Allergies  Allergen Reactions  . Other Swelling    All Nuts  . Shellfish Allergy     Allergy determined by allergy test - patient does not eat   Prior to Admission medications   Medication Sig Start Date End Date Taking? Authorizing Provider    clonazePAM (KLONOPIN) 0.5 MG tablet Take 1 tablet (0.5 mg total) by mouth 2 (two) times daily as needed for anxiety. Takes 1/2 pill prn. 04/03/13  Yes Tonye Pearson, MD  Multiple Vitamin (MULTIVITAMIN) tablet Take by mouth daily. Takes 1/2 daily.   Yes Historical Provider, MD  nebivolol (BYSTOLIC) 10 MG tablet Take 5 mg by mouth daily.   Yes Historical Provider, MD  ranitidine (ZANTAC) 150 MG tablet Take 150 mg by mouth 2 (two) times daily.    Historical Provider, MD   Social History   Social History  . Marital Status: Divorced    Spouse Name: N/A  . Number of Children: N/A  . Years of Education: N/A   Occupational History  . Not on file.   Social History Main Topics  . Smoking status: Never Smoker   . Smokeless tobacco: Never Used  . Alcohol Use: No  . Drug Use: No  . Sexual Activity: Yes   Other Topics Concern  . Not on file   Social History Narrative   4 pregnancies   3 live births   1 miscarriage     Review of Systems  Constitutional: Positive for fever. Negative for chills and diaphoresis.  HENT: Positive for ear pain. Negative for drooling.   Eyes: Positive for pain.  Negative for redness.  Respiratory: Positive for cough. Negative for choking and shortness of breath.   Gastrointestinal: Negative for nausea and vomiting.  Musculoskeletal: Negative for neck pain and neck stiffness.  Skin: Negative for rash.  Neurological: Positive for headaches. Negative for speech difficulty.       Objective:   Physical Exam  Filed Vitals:   04/08/15 1623  BP: 142/92  Pulse: 91  Temp: 98.7 F (37.1 C)  TempSrc: Oral  Resp: 17  Height:  (1.702 m)  Weight: 156 lb (70.761 kg)  SpO2: 99%    CONSTITUTIONAL: Well developed/well nourished HEAD: Normocephalic/atraumatic EYES: EOMI/PERRL ENMT: Mucous membranes moist, she has nasal congestion with blue turnbinates SPINE/BACK:entire spine nontender CV: S1/S2 noted, no murmurs/rubs/gallops noted LUNGS: No audible  wheezes but decreased breath sounds in both bases.  NEURO: Pt is awake/alert/appropriate, moves all extremitiesx4.  No facial droop.   EXTREMITIES: pulses normal/equal, full ROM SKIN: warm, color normal PSYCH: no abnormalities of mood noted, alert and oriented to situation  [facility] (PRIMARY) x-ray report read by Dr. Cleta Alberts: Heart size upper limits of normal. No definite infiltrate seen. Results for orders placed or performed in visit on 04/08/15  POCT CBC  Result Value Ref Range   WBC 9.9 4.6 - 10.2 K/uL   Lymph, poc 2.2 0.6 - 3.4   POC LYMPH PERCENT 22.5 10 - 50 %L   MID (cbc) 0.9 0 - 0.9   POC MID % 9.0 0 - 12 %M   POC Granulocyte 6.8 2 - 6.9   Granulocyte percent 68.5 37 - 80 %G   RBC 5.28 4.04 - 5.48 M/uL   Hemoglobin 13.9 12.2 - 16.2 g/dL   HCT, POC 16.1 09.6 - 47.9 %   MCV 81.2 80 - 97 fL   MCH, POC 26.3 (A) 27 - 31.2 pg   MCHC 32.4 31.8 - 35.4 g/dL   RDW, POC 04.5 %   Platelet Count, POC 193 142 - 424 K/uL   MPV 10.0 0 - 99.8 fL       Assessment & Plan:  Patient states that all medications use for her lungs cause tachycardia and palpitations. She underwent a stress test this summer which was normal. She is currently on Zantac twice a day. Will treat with Singulair one at night along with Qvar 80 2 puffs twice a day.I personally performed the services described in this documentation, which was scribed in my presence. The recorded information has been reviewed and is accurate.

## 2015-04-10 ENCOUNTER — Telehealth: Payer: Self-pay

## 2015-04-10 DIAGNOSIS — T7840XA Allergy, unspecified, initial encounter: Secondary | ICD-10-CM

## 2015-04-10 MED ORDER — EPINEPHRINE 0.3 MG/0.3ML IJ SOAJ: 0.3000 mg | Freq: Once | INTRAMUSCULAR | Status: DC

## 2015-04-10 NOTE — Telephone Encounter (Signed)
Pt states she usually gets an epipen from her allergist, although she has never had to use. She states this morning she felt like her throat was closing up but realized her epipen was expired. She feels fine now, no longer with sensation that throat is closing and no difficulty breathing. She has never had an anaphylactic reaction before. Epipen refilled and advised if those symptoms recur, she should be seen ASAP.

## 2015-04-10 NOTE — Telephone Encounter (Signed)
Pt saw dr Cleta Alberts recently and forgot to get a rx for an epi pen

## 2015-04-10 NOTE — Telephone Encounter (Signed)
Can we Rx? 

## 2015-04-14 DIAGNOSIS — J309 Allergic rhinitis, unspecified: Secondary | ICD-10-CM | POA: Insufficient documentation

## 2015-04-14 DIAGNOSIS — Z91018 Allergy to other foods: Secondary | ICD-10-CM | POA: Insufficient documentation

## 2015-04-14 DIAGNOSIS — T781XXA Other adverse food reactions, not elsewhere classified, initial encounter: Secondary | ICD-10-CM | POA: Insufficient documentation

## 2015-04-15 ENCOUNTER — Ambulatory Visit: Payer: BC Managed Care – PPO | Admitting: Allergy and Immunology

## 2015-04-16 ENCOUNTER — Ambulatory Visit (INDEPENDENT_AMBULATORY_CARE_PROVIDER_SITE_OTHER): Payer: BC Managed Care – PPO | Admitting: Allergy and Immunology

## 2015-04-16 ENCOUNTER — Encounter: Payer: Self-pay | Admitting: Allergy and Immunology

## 2015-04-16 VITALS — BP 140/85 | HR 85 | Temp 98.0°F | Resp 16

## 2015-04-16 DIAGNOSIS — R05 Cough: Secondary | ICD-10-CM

## 2015-04-16 DIAGNOSIS — Z91013 Allergy to seafood: Secondary | ICD-10-CM

## 2015-04-16 DIAGNOSIS — J309 Allergic rhinitis, unspecified: Secondary | ICD-10-CM

## 2015-04-16 DIAGNOSIS — Z91018 Allergy to other foods: Secondary | ICD-10-CM

## 2015-04-16 DIAGNOSIS — R059 Cough, unspecified: Secondary | ICD-10-CM

## 2015-04-16 DIAGNOSIS — J3089 Other allergic rhinitis: Secondary | ICD-10-CM

## 2015-04-16 MED ORDER — OLOPATADINE HCL 0.6 % NA SOLN: NASAL | Status: DC

## 2015-04-16 NOTE — Patient Instructions (Addendum)
Take Home Sheet  1. Avoidance: of foods as previously.  2. Antihistamine:  Claritin one half to one teaspoon by mouth once daily for runny nose or itching as needed.  3. Nasal Spray: Patanase one spray(s) each nostril every three days for stuffy nose or drainage as discussed.   4. Nasal Saline wash followed by nasal spray three times daily as directed.  5.  Epi-pen as needed.  6.  Remain off  Singulair, QVAR for now given intolerance.  7. Follow up Visit:  In 2  Months or sooner if needed.   Websites that have reliable Patient information: 1. American Academy of Asthma, Allergy, & Immunology: www.aaaai.org 2. Food Allergy Network: www.foodallergy.org 3. Mothers of Asthmatics: www.aanma.org 4. [facility]: https://www.strong.com/ 5. American College of Allergy, Asthma, & Immunology: BiggerRewards.is or www.acaai.org

## 2015-04-19 NOTE — Progress Notes (Signed)
History of present illness: Amanda Swanson returns to the office  regarding congestion and cough, though last visit  in our office was 2015.  She reports inability to tolerate several medications as related to increasing blood pressure.  Therefore she is not taking Singulair, any oral antihistamines or intranasal steroids and did not try QVAR that was recently prescribed the Urgent Care at [facility].  She understands there was a normal CXR and normal labs with that visit.  Her symptoms has been present for approximately one week but improved in the last 3 days and mostly congestion/post nasal drip triggered cough.  She denies fever, sorethroat, headache, wheezing, shortness of breath, disrupted sleep or activity.  She typically reports increasing symptoms with perfume/strong odor exposure.  She reports since her symptoms began, she took low dose Benadryl and subsequently has been working to manage BP more tightly. No other concerns.   Outpatient medications:   Medication List       This list is accurate as of: 04/16/15 11:59 PM.  Always use your most recent med list.               beclomethasone 80 MCG/ACT inhaler  Commonly known as:  QVAR  Inhale 2 puffs into the lungs 2 (two) times daily.     clonazePAM 0.5 MG tablet  Commonly known as:  KLONOPIN  Take 1 tablet (0.5 mg total) by mouth 2 (two) times daily as needed for anxiety. Takes 1/2 pill prn.     EPINEPHrine 0.3 mg/0.3 mL Soaj injection  Commonly known as:  EPIPEN 2-PAK  Inject 0.3 mLs (0.3 mg total) into the muscle once.     montelukast 10 MG tablet  Commonly known as:  SINGULAIR  Take 1 tablet (10 mg total) by mouth at bedtime.     multivitamin tablet  Take by mouth daily. Takes 1/2 daily.     nebivolol 10 MG tablet  Commonly known as:  BYSTOLIC  Take 5 mg by mouth daily.     Olopatadine HCl 0.6 % Soln  Use one spray in each nostril once a day for stuffy nose or drainage.     ranitidine 150 MG tablet  Commonly known as:   ZANTAC  Take 150 mg by mouth 2 (two) times daily.        Known medication allergies: Allergies  Allergen Reactions  . Other Swelling    All Nuts  . Shellfish Allergy     Allergy determined by allergy test - patient does not eat    Physical examination: Blood pressure 140/85, pulse 85, temperature 98 F (36.7 C), resp. rate 16. General: Alert, interactive, in no acute distress. HEENT: TMs pearly gray, turbinates minimally edematous, post-pharynx not erythematous. Neck: Supple without lymphadenopathy. Lungs: Clear to auscultation without wheezing, rhonchi or rales. CV: Normal S1, S2 without murmurs. Skin: Warm and dry, without lesions or rashes.  Diagnositics:  FVC          -- %, FEV1     --  Assessment:  1.  History of allergic rhinitis and associated non-allergic rhinitis with intermittent symptoms. 2.  Post-nasal drip. 3.  Cough, afebrile with clear lung exam. 4.  Patient report of multiple medications triggering elevation of Blood pressure with history of hypertension on daily medication. 5.  Food allergy--avoidance and emergency action plan in place.   Plan: 1.  Keniah with consistently use Saline nasal wash twice to four times daily. 2. Trial of Patanase one spray each nostril every 3 days as tolerated.  3.  If persisting symptoms consider trial of Claritin 1/2 to 1 teaspoon once daily as needed. 4  Epi-pen/Benadryl as needed.    5.  Remain off Singulair and QVAR.  6.  Follow-up in 2 months or sooner if needed.                                          Roselyn M. Willa Rough, MD

## 2015-04-22 ENCOUNTER — Other Ambulatory Visit: Payer: Self-pay | Admitting: Internal Medicine

## 2015-05-12 ENCOUNTER — Other Ambulatory Visit: Payer: Self-pay | Admitting: Endocrinology

## 2015-05-12 DIAGNOSIS — E049 Nontoxic goiter, unspecified: Secondary | ICD-10-CM

## 2015-06-10 ENCOUNTER — Encounter: Payer: Self-pay | Admitting: Internal Medicine

## 2015-06-10 ENCOUNTER — Ambulatory Visit (INDEPENDENT_AMBULATORY_CARE_PROVIDER_SITE_OTHER): Payer: BC Managed Care – PPO | Admitting: Internal Medicine

## 2015-06-10 VITALS — BP 146/79 | HR 78 | Temp 97.9°F | Resp 16 | Ht 67.0 in | Wt 158.0 lb

## 2015-06-10 DIAGNOSIS — R002 Palpitations: Secondary | ICD-10-CM | POA: Diagnosis not present

## 2015-06-10 DIAGNOSIS — Z0000 Encounter for general adult medical examination without abnormal findings: Secondary | ICD-10-CM | POA: Diagnosis not present

## 2015-06-10 DIAGNOSIS — F419 Anxiety disorder, unspecified: Secondary | ICD-10-CM | POA: Diagnosis not present

## 2015-06-10 DIAGNOSIS — I10 Essential (primary) hypertension: Secondary | ICD-10-CM

## 2015-06-10 DIAGNOSIS — E785 Hyperlipidemia, unspecified: Secondary | ICD-10-CM | POA: Diagnosis not present

## 2015-06-10 DIAGNOSIS — J302 Other seasonal allergic rhinitis: Secondary | ICD-10-CM

## 2015-06-10 LAB — CBC WITH DIFFERENTIAL/PLATELET
Basophils Absolute: 0 10*3/uL (ref 0.0–0.1)
Basophils Relative: 0 % (ref 0–1)
Eosinophils Absolute: 0.1 10*3/uL (ref 0.0–0.7)
Eosinophils Relative: 2 % (ref 0–5)
HCT: 42.8 % (ref 36.0–46.0)
Hemoglobin: 14.3 g/dL (ref 12.0–15.0)
Lymphocytes Relative: 26 % (ref 12–46)
Lymphs Abs: 1.7 10*3/uL (ref 0.7–4.0)
MCH: 28 pg (ref 26.0–34.0)
MCHC: 33.4 g/dL (ref 30.0–36.0)
MCV: 83.8 fL (ref 78.0–100.0)
MPV: 12.5 fL — ABNORMAL HIGH (ref 8.6–12.4)
Monocytes Absolute: 0.5 10*3/uL (ref 0.1–1.0)
Monocytes Relative: 8 % (ref 3–12)
Neutro Abs: 4.2 10*3/uL (ref 1.7–7.7)
Neutrophils Relative %: 64 % (ref 43–77)
Platelets: 187 10*3/uL (ref 150–400)
RBC: 5.11 MIL/uL (ref 3.87–5.11)
RDW: 14.7 % (ref 11.5–15.5)
WBC: 6.6 10*3/uL (ref 4.0–10.5)

## 2015-06-10 LAB — COMPREHENSIVE METABOLIC PANEL
ALK PHOS: 82 U/L (ref 33–130)
ALT: 16 U/L (ref 6–29)
AST: 19 U/L (ref 10–35)
Albumin: 4.5 g/dL (ref 3.6–5.1)
BUN: 13 mg/dL (ref 7–25)
CALCIUM: 9.8 mg/dL (ref 8.6–10.4)
CHLORIDE: 102 mmol/L (ref 98–110)
CO2: 27 mmol/L (ref 20–31)
Creat: 0.7 mg/dL (ref 0.50–0.99)
GLUCOSE: 84 mg/dL (ref 65–99)
Potassium: 4.2 mmol/L (ref 3.5–5.3)
Sodium: 140 mmol/L (ref 135–146)
Total Bilirubin: 0.8 mg/dL (ref 0.2–1.2)
Total Protein: 7.6 g/dL (ref 6.1–8.1)

## 2015-06-10 LAB — LIPID PANEL
CHOL/HDL RATIO: 3.2 ratio (ref <=5.0)
Cholesterol: 211 mg/dL — ABNORMAL HIGH (ref 125–200)
HDL: 66 mg/dL (ref >=46)
LDL CALC: 123 mg/dL (ref <130)
Triglycerides: 108 mg/dL (ref <150)
VLDL: 22 mg/dL (ref <30)

## 2015-06-10 MED ORDER — CLONAZEPAM 0.5 MG PO TABS: 0.5000 mg | ORAL_TABLET | Freq: Two times a day (BID) | ORAL | Status: DC | PRN

## 2015-06-10 MED ORDER — NEBIVOLOL HCL 10 MG PO TABS: ORAL_TABLET | ORAL | Status: DC

## 2015-06-10 NOTE — Progress Notes (Signed)
Subjective:    Patient ID: Amanda Swanson, female    DOB: August 14, 1950, 64 y.o.   MRN: [id number]  HPIannual exam Patient Active Problem List   Diagnosis Date Noted  . Allergic rhinitis 04/14/2015  . Food allergy 04/14/2015  . Oral allergy syndrome 04/14/2015  . Palpitations 04/03/2013  . hyperlipidemia 02/10/2012  . Anxiety 08/26/2011  . HTN (hypertension) 08/26/2011  She continues to try to avoid all medications and use nonmedicinal AIDS for her problems. She desperately well with this although she still has significant anxiety causing palpitations times.  Dr Jenetta LogesLavoie-2w ago br exam-pap not due this yr Dr hicks-allergy evaluation recent and there were no suggestions that she was able to endorse Dr balan-followed by endocrinology-nodule/a1c Dr Hilty-cardiology evaluation because of palpitations was basically normal  Stable relationship Family and social history stable Health maintenance is incorrect about Pap smear. She is not willing to commit to Zostavax. Review of Systems  Constitutional: Negative for fever, chills, activity change, appetite change, fatigue and unexpected weight change.  HENT: Positive for rhinorrhea and sneezing. Negative for ear pain, hearing loss and trouble swallowing.   Eyes: Negative for visual disturbance.  Respiratory: Negative for cough and shortness of breath.   Cardiovascular: Negative for chest pain and leg swelling.  Gastrointestinal: Negative for abdominal pain, diarrhea and constipation.       She continues with IBS-like symptoms that she recognizes are connected to her anxiety  Endocrine: Negative for polydipsia, polyphagia and polyuria.  Genitourinary: Negative for dysuria and frequency.  Neurological: Negative for dizziness, speech difficulty, weakness, numbness and headaches.  Psychiatric/Behavioral: Positive for sleep disturbance. Negative for behavioral problems, dysphoric mood and decreased concentration. The patient is nervous/anxious.         She continues with some sleep dysfunction. She wakes after 4 hours to use the bathroom and then takes a long time to go back to sleep interfering with daytime function. She is unwilling to take medications.      Objective:   Physical Exam  Constitutional: She is oriented to person, place, and time. She appears well-developed and well-nourished. No distress.  HENT:  Head: Normocephalic.  Right Ear: External ear normal.  Left Ear: External ear normal.  Nose: Nose normal.  Mouth/Throat: Oropharynx is clear and moist.  Eyes: Conjunctivae and EOM are normal. Pupils are equal, round, and reactive to light.  Neck: Normal range of motion. Neck supple. No thyromegaly present.  Cardiovascular: Normal rate, regular rhythm, normal heart sounds and intact distal pulses.   No murmur heard. Pulmonary/Chest: Effort normal and breath sounds normal. She has no wheezes.  Abdominal: Soft. Bowel sounds are normal. She exhibits no distension and no mass. There is no tenderness. There is no rebound.  Musculoskeletal: Normal range of motion. She exhibits no edema or tenderness.  Lymphadenopathy:    She has no cervical adenopathy.  Neurological: She is alert and oriented to person, place, and time. She has normal reflexes. No cranial nerve deficit.  Skin: Skin is warm and dry. No rash noted.  Psychiatric: She has a normal mood and affect. Her behavior is normal. Judgment and thought content normal.  Nursing note and vitals reviewed. BP 146/79 mmHg  Pulse 78  Temp(Src) 97.9 F (36.6 C)  Resp 16  Ht 5\' 7"  (1.702 m)  Wt 158 lb (71.668 kg)  BMI 24.74 kg/m2         Assessment & Plan:  Annual physical exam - Plan: HIV antibody, Hepatitis C antibody  hyperlipidemia - Plan:  Lipid panel  Essential hypertension - Plan: Comprehensive metabolic panel, CBC with Differential/Platelet  Other seasonal allergic rhinitis  Palpitations  Anxiety - Plan: clonazePAM (KLONOPIN) 0.5 MG tablet--has used  less than 30 tabs in last year  bmi 24  Meds ordered this encounter  Medications  . nebivolol (BYSTOLIC) 10 MG tablet    Sig: TAKE 1 TABLET (10 MG TOTAL) BY MOUTH DAILY    Dispense:  90 tablet    Refill:  3  . clonazePAM (KLONOPIN) 0.5 MG tablet    Sig: Take 1 tablet (0.5 mg total) by mouth 2 (two) times daily as needed for anxiety. Takes 1/2 pill prn.    Dispense:  30 tablet    Refill:  0

## 2015-06-11 LAB — HIV ANTIBODY (ROUTINE TESTING W REFLEX): HIV: NONREACTIVE

## 2015-06-11 LAB — HEPATITIS C ANTIBODY: HCV Ab: NEGATIVE

## 2015-06-12 ENCOUNTER — Encounter: Payer: Self-pay | Admitting: Internal Medicine

## 2015-06-15 ENCOUNTER — Encounter: Payer: Self-pay | Admitting: Internal Medicine

## 2015-10-21 IMAGING — US US SOFT TISSUE HEAD/NECK
1 series · 14 of 25 positions shown · non-contrast
Comparison: None.

CLINICAL DATA: History of thyroid nodules

EXAM:
THYROID ULTRASOUND
TECHNIQUE: Ultrasound examination of the thyroid gland and adjacent soft
tissues was performed.

[Series 1: us soft tissue head/neck · 0.07mm/px · 14 of 47 slices shown]
[im 1/47]
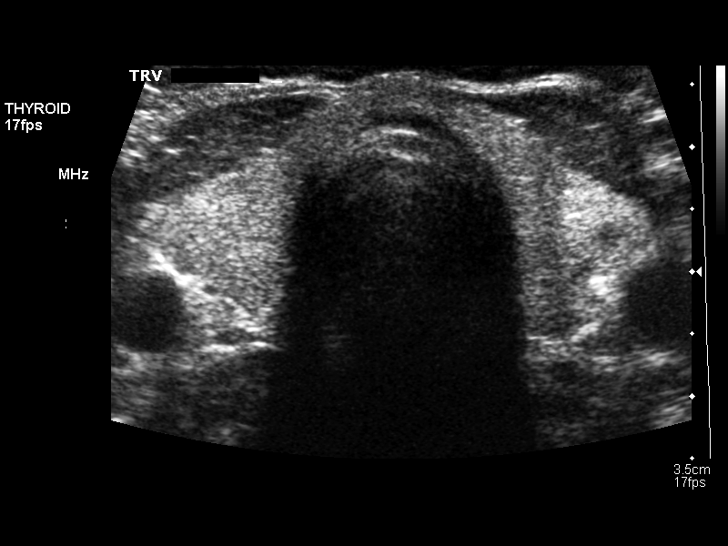
[im 4/47]
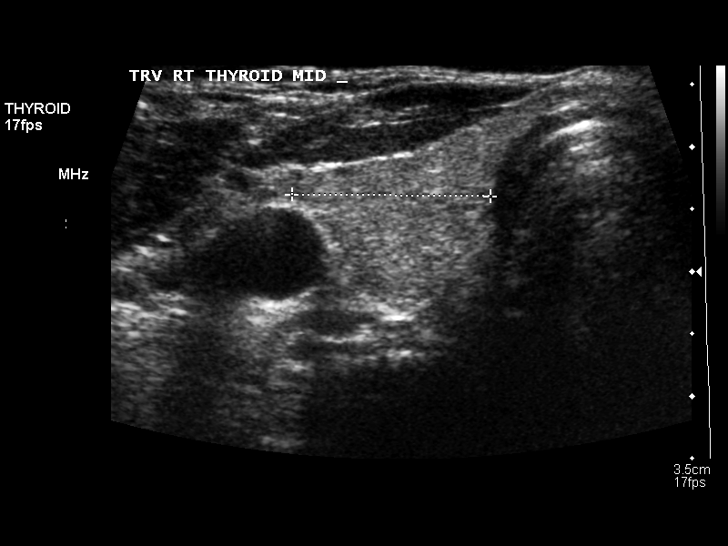
[im 8/47]
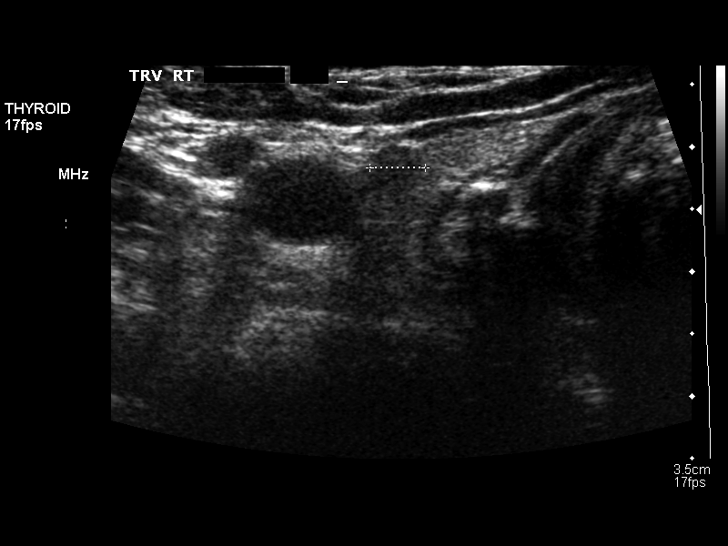
[im 12/47]
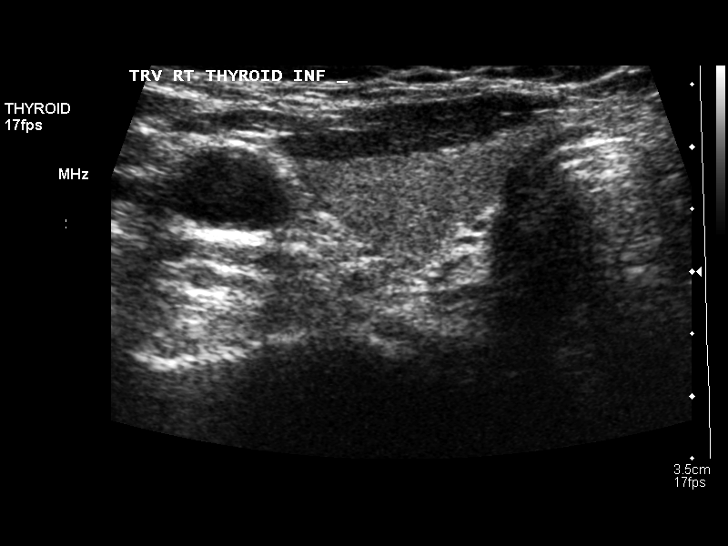
[im 16/47]
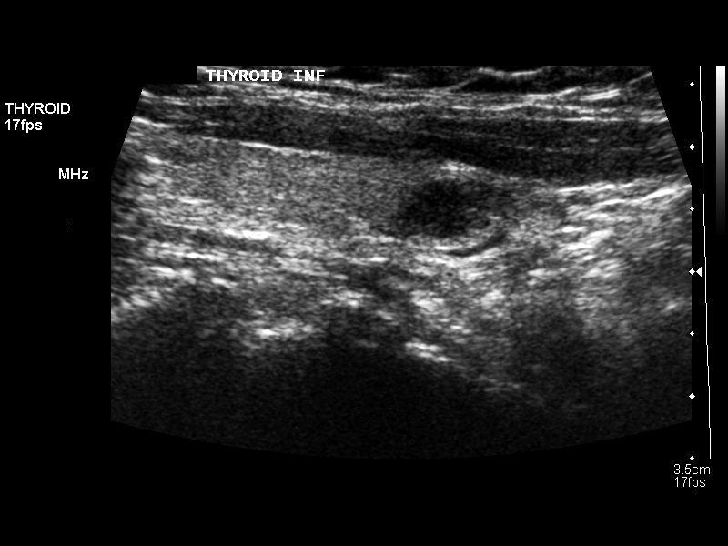
[im 18/47]
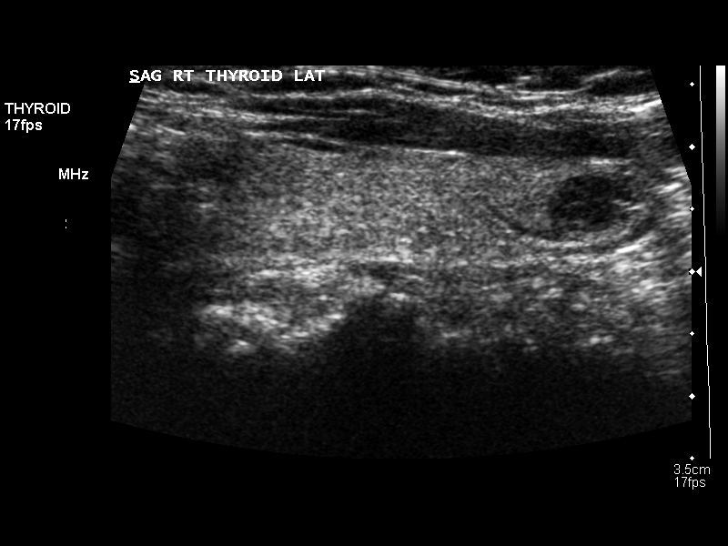
[im 22/47]
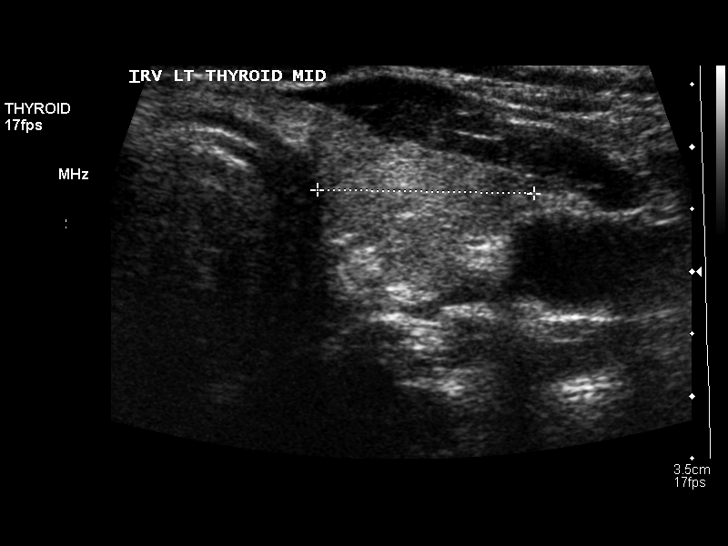
[im 25/47]
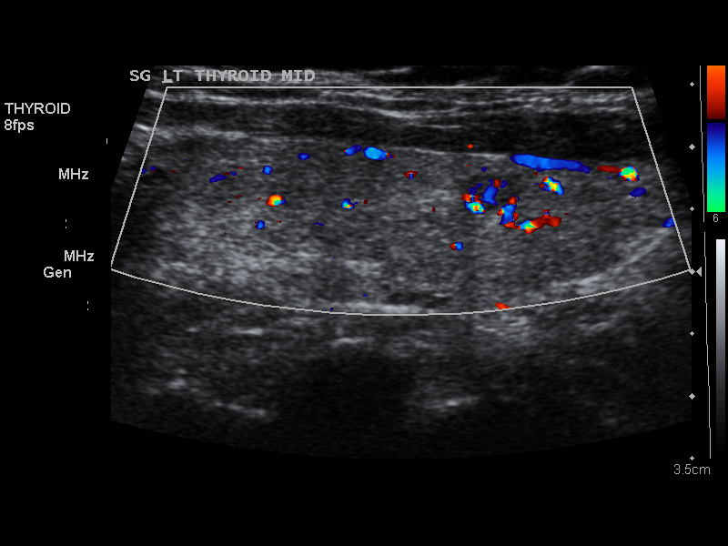
[im 29/47]
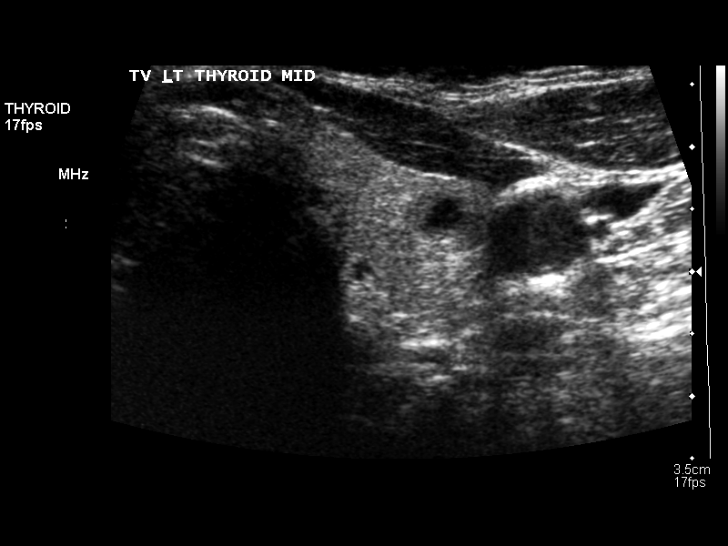
[im 31/47]
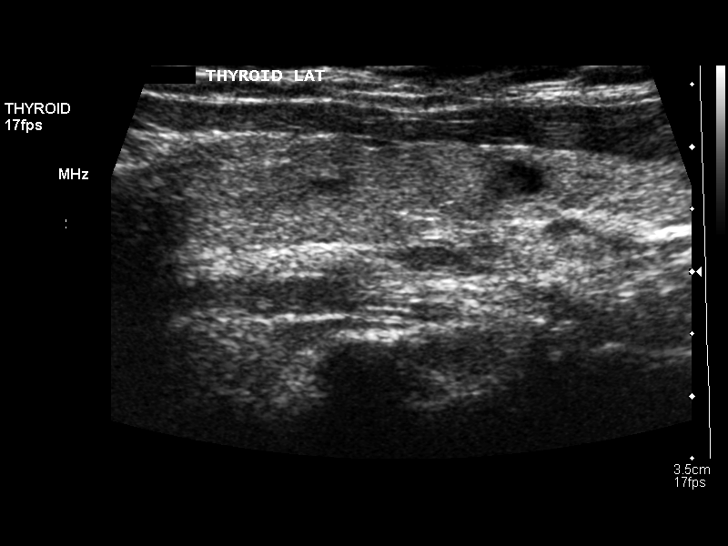
[im 35/47]
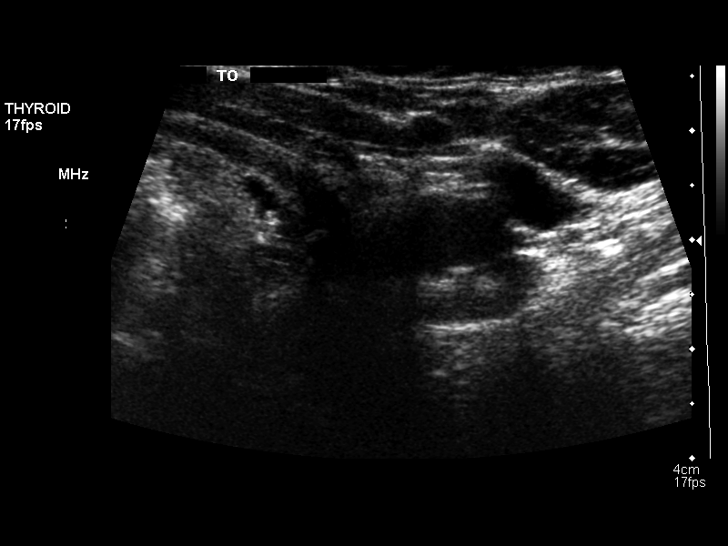
[im 39/47]
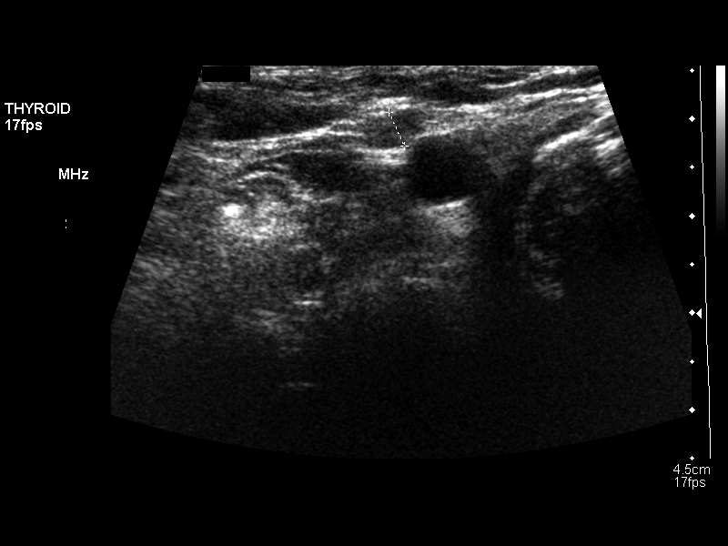
[im 43/47]
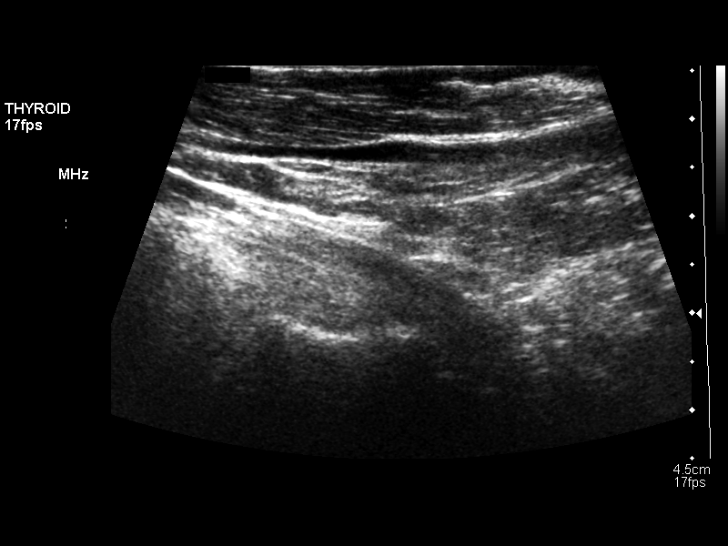
[im 47/47]
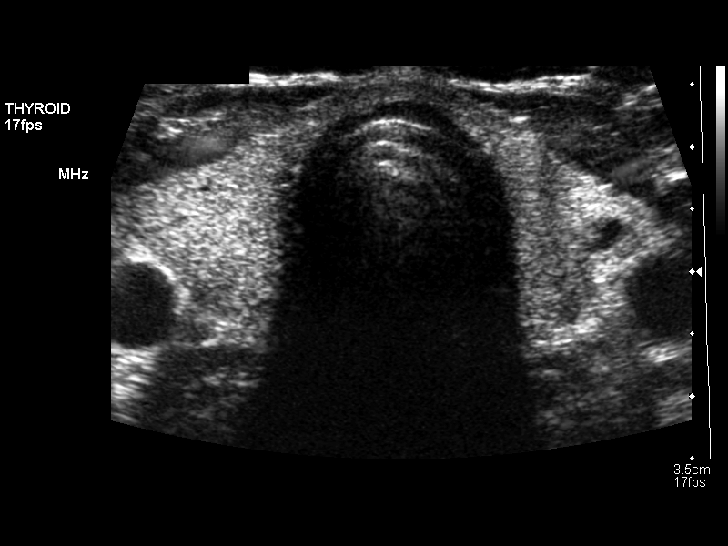

[14 of 25 positions shown; findings below may reference images not displayed]

FINDINGS: Right thyroid lobe

Measurements: 4.4 x 1.4 x 1.6 cm.. Two small right thyroid nodules
are present. A small solid nodule in the upper pole measures 4 mm in
diameter. A complex nodule in the lower pole measures 1.1 x 0.7 x
0.8 cm.

Left thyroid lobe

Measurements: 4.9 x 1.2 x 1.7 cm.. A complex nodule in the mid left
lobe of thyroid measures 8 mm in diameter.

Isthmus

Thickness: 3.4 mm..  No nodules visualized.

Lymphadenopathy

None visualized.
IMPRESSION: Small thyroid nodules are noted with the largest in the lower pole
of the right lobe of 1.1 cm. Consider follow-up ultrasound in 1
year.

## 2016-02-06 ENCOUNTER — Emergency Department (HOSPITAL_COMMUNITY)
Admission: EM | Admit: 2016-02-06 | Discharge: 2016-02-06 | Disposition: A | Discharge status: Home / Self Care | Payer: BC Managed Care – PPO | Attending: Physician Assistant | Admitting: Physician Assistant

## 2016-02-06 ENCOUNTER — Ambulatory Visit (INDEPENDENT_AMBULATORY_CARE_PROVIDER_SITE_OTHER): Payer: BC Managed Care – PPO | Admitting: Family Medicine

## 2016-02-06 ENCOUNTER — Encounter (HOSPITAL_COMMUNITY): Payer: Self-pay

## 2016-02-06 VITALS — BP 140/92 | HR 92 | Temp 97.7°F | Resp 18 | Ht 67.0 in | Wt 163.0 lb

## 2016-02-06 DIAGNOSIS — R112 Nausea with vomiting, unspecified: Secondary | ICD-10-CM | POA: Diagnosis not present

## 2016-02-06 DIAGNOSIS — R0789 Other chest pain: Secondary | ICD-10-CM | POA: Diagnosis not present

## 2016-02-06 DIAGNOSIS — E860 Dehydration: Secondary | ICD-10-CM | POA: Diagnosis not present

## 2016-02-06 DIAGNOSIS — R42 Dizziness and giddiness: Secondary | ICD-10-CM

## 2016-02-06 DIAGNOSIS — Z79899 Other long term (current) drug therapy: Secondary | ICD-10-CM | POA: Insufficient documentation

## 2016-02-06 DIAGNOSIS — I10 Essential (primary) hypertension: Secondary | ICD-10-CM | POA: Insufficient documentation

## 2016-02-06 LAB — COMPLETE METABOLIC PANEL WITH GFR
ALBUMIN: 4.6 g/dL (ref 3.6–5.1)
ALK PHOS: 86 U/L (ref 33–130)
ALT: 15 U/L (ref 6–29)
AST: 20 U/L (ref 10–35)
BILIRUBIN TOTAL: 0.6 mg/dL (ref 0.2–1.2)
BUN: 16 mg/dL (ref 7–25)
CALCIUM: 9.8 mg/dL (ref 8.6–10.4)
CO2: 24 mmol/L (ref 20–31)
CREATININE: 1.01 mg/dL — AB (ref 0.50–0.99)
Chloride: 104 mmol/L (ref 98–110)
GFR, EST AFRICAN AMERICAN: 68 mL/min (ref >=60)
GFR, Est Non African American: 59 mL/min — ABNORMAL LOW (ref >=60)
Glucose, Bld: 113 mg/dL — ABNORMAL HIGH (ref 65–99)
Potassium: 4.4 mmol/L (ref 3.5–5.3)
Sodium: 138 mmol/L (ref 135–146)
TOTAL PROTEIN: 7.4 g/dL (ref 6.1–8.1)

## 2016-02-06 LAB — CBC WITH DIFFERENTIAL/PLATELET
BASOS ABS: 0 10*3/uL (ref 0.0–0.1)
BASOS PCT: 0 %
EOS ABS: 0 10*3/uL (ref 0.0–0.7)
Eosinophils Relative: 0 %
HCT: 44.5 % (ref 36.0–46.0)
HEMOGLOBIN: 14.5 g/dL (ref 12.0–15.0)
Lymphocytes Relative: 9 %
Lymphs Abs: 1.1 10*3/uL (ref 0.7–4.0)
MCH: 27.5 pg (ref 26.0–34.0)
MCHC: 32.6 g/dL (ref 30.0–36.0)
MCV: 84.3 fL (ref 78.0–100.0)
MONOS PCT: 5 %
Monocytes Absolute: 0.6 10*3/uL (ref 0.1–1.0)
NEUTROS PCT: 86 %
Neutro Abs: 9.8 10*3/uL — ABNORMAL HIGH (ref 1.7–7.7)
Platelets: 194 10*3/uL (ref 150–400)
RBC: 5.28 MIL/uL — ABNORMAL HIGH (ref 3.87–5.11)
RDW: 14.5 % (ref 11.5–15.5)
WBC: 11.5 10*3/uL — ABNORMAL HIGH (ref 4.0–10.5)

## 2016-02-06 LAB — COMPREHENSIVE METABOLIC PANEL
ALBUMIN: 4.3 g/dL (ref 3.5–5.0)
ALT: 19 U/L (ref 14–54)
ANION GAP: 8 (ref 5–15)
AST: 28 U/L (ref 15–41)
Alkaline Phosphatase: 79 U/L (ref 38–126)
BILIRUBIN TOTAL: 0.5 mg/dL (ref 0.3–1.2)
BUN: 13 mg/dL (ref 6–20)
CHLORIDE: 108 mmol/L (ref 101–111)
CO2: 24 mmol/L (ref 22–32)
Calcium: 9.6 mg/dL (ref 8.9–10.3)
Creatinine, Ser: 0.86 mg/dL (ref 0.44–1.00)
GFR calc Af Amer: >60 mL/min (ref >60)
GFR calc non Af Amer: >60 mL/min (ref >60)
GLUCOSE: 121 mg/dL — AB (ref 65–99)
POTASSIUM: 4.3 mmol/L (ref 3.5–5.1)
SODIUM: 140 mmol/L (ref 135–145)
TOTAL PROTEIN: 7.6 g/dL (ref 6.5–8.1)

## 2016-02-06 LAB — I-STAT TROPONIN, ED: Troponin i, poc: 0 ng/mL (ref 0.00–0.08)

## 2016-02-06 LAB — POCT CBC
GRANULOCYTE PERCENT: 76 % (ref 37–80)
HEMATOCRIT: 42 % (ref 37.7–47.9)
Hemoglobin: 14.6 g/dL (ref 12.2–16.2)
LYMPH, POC: 2.2 (ref 0.6–3.4)
MCH, POC: 28.3 pg (ref 27–31.2)
MCHC: 34.9 g/dL (ref 31.8–35.4)
MCV: 81 fL (ref 80–97)
MID (CBC): 0.2 (ref 0–0.9)
MPV: 10.6 fL (ref 0–99.8)
POC GRANULOCYTE: 7.7 — AB (ref 2–6.9)
POC LYMPH %: 22.1 % (ref 10–50)
POC MID %: 1.9 % (ref 0–12)
Platelet Count, POC: 152 10*3/uL (ref 142–424)
RBC: 5.18 M/uL (ref 4.04–5.48)
RDW, POC: 14.1 %
WBC: 10.1 10*3/uL (ref 4.6–10.2)

## 2016-02-06 LAB — GLUCOSE, POCT (MANUAL RESULT ENTRY): POC GLUCOSE: 110 mg/dL — AB (ref 70–99)

## 2016-02-06 MED ORDER — ONDANSETRON 4 MG PO TBDP
4.0000 mg | ORAL_TABLET | Freq: Once | ORAL | Status: AC
  Administered 2016-02-06: 4 mg via ORAL

## 2016-02-06 NOTE — ED Notes (Signed)
Pt ambulated to bathroom 

## 2016-02-06 NOTE — ED Notes (Signed)
Pt. Coming from urgent care via [facility] for dizziness. Pt. sts that she had dizziness/nausea/vomiting with sudden onset at 1100 while getting into a hot car. Pt. Hx of vertigo. Pt. Sent here by urgent care because of questionable ST depression on EKG, which she has a hx of. Stress test a year ago for the same. Pt. Given 4mg  zofran by urgen care. Pt. Aox4.

## 2016-02-06 NOTE — ED Notes (Signed)
EDP at bedside  

## 2016-02-06 NOTE — ED Provider Notes (Signed)
CSN: [id number]     Arrival date & time 02/06/16  1412 History   First MD Initiated Contact with Patient 02/06/16 1417     Chief Complaint  Patient presents with  . Dizziness     (Consider location/radiation/quality/duration/timing/severity/associated sxs/prior Treatment) HPI   Patietn was out in the heat off/on from 8:30 to 11 am (101 heat index) aned felt nauseated.  She vomitied. She sometimes gets lightheadedness from "chemical sensitivities" and when she doesn't eat on time.  Today she felt similarly and then [facility] to urgent care. She thinks she feels this way with low sugar.  At Urgent care they found ST depressions.  SHe had this in the past, no change.  No chest pain. SHe had a negative stress test last year for this.   Ho what sounds like BPPV   Past Medical History  Diagnosis Date  . Allergy   . Cataract   . Heart murmur   . Hypertension   . Anxiety   . IBS (irritable bowel syndrome)    Past Surgical History  Procedure Laterality Date  . Breast surgery     Family History  Problem Relation Age of Onset  . Heart disease Father     MI  . Cancer Sister     breast  . Diabetes Sister     also hypertension   . Diabetes Brother   . Hypertension Brother     x3  . Cancer Mother   . Diabetes Brother    Social History  Substance Use Topics  . Smoking status: Never Smoker   . Smokeless tobacco: Never Used  . Alcohol Use: No   OB History    No data available     Review of Systems  Constitutional: Negative for activity change.  HENT: Negative for congestion.   Respiratory: Negative for shortness of breath.   Cardiovascular: Negative for chest pain.  Gastrointestinal: Positive for nausea and vomiting. Negative for abdominal pain.  Genitourinary: Negative for dysuria.  Neurological: Positive for weakness and light-headedness.  Psychiatric/Behavioral: Negative for confusion.  All other systems reviewed and are negative.     Allergies  Other and Shellfish  allergy  Home Medications   Prior to Admission medications   Medication Sig Start Date End Date Taking? Authorizing Provider  clonazePAM (KLONOPIN) 0.5 MG tablet Take 1 tablet (0.5 mg total) by mouth 2 (two) times daily as needed for anxiety. Takes 1/2 pill prn. 06/10/15   Tonye Pearson, MD  EPINEPHrine (EPIPEN 2-PAK) 0.3 mg/0.3 mL IJ SOAJ injection Inject 0.3 mLs (0.3 mg total) into the muscle once. 04/10/15   Dorna Leitz, PA-C  montelukast (SINGULAIR) 10 MG tablet Take 1 tablet (10 mg total) by mouth at bedtime. Patient not taking: Reported on 02/06/2016 04/08/15   Collene Gobble, MD  Multiple Vitamin (MULTIVITAMIN) tablet Take by mouth daily. Takes 1/2 daily.    Historical Provider, MD  nebivolol (BYSTOLIC) 10 MG tablet TAKE 1 TABLET (10 MG TOTAL) BY MOUTH DAILY 06/10/15   Tonye Pearson, MD   Ht 5\' 6"  (1.676 m)  Wt 164 lb (74.39 kg)  BMI 26.48 kg/m2  SpO2 98% Physical Exam  Constitutional: She is oriented to person, place, and time. She appears well-developed and well-nourished.  HENT:  Head: Normocephalic and atraumatic.  Eyes: Conjunctivae are normal. Right eye exhibits no discharge.  Neck: Neck supple.  Cardiovascular: Normal rate, regular rhythm and normal heart sounds.   No murmur heard. Pulmonary/Chest: Effort normal and breath sounds  normal. She has no wheezes. She has no rales.  Abdominal: Soft. She exhibits no distension. There is no tenderness.  Musculoskeletal: Normal range of motion. She exhibits no edema.  Neurological: She is oriented to person, place, and time. No cranial nerve deficit.  Skin: Skin is warm and dry. No rash noted. She is not diaphoretic.  Psychiatric: She has a normal mood and affect. Her behavior is normal.  Nursing note and vitals reviewed.   ED Course  Procedures (including critical care time) Labs Review Labs Reviewed - No data to display  Imaging Review No results found. I have personally reviewed and evaluated these images and  lab results as part of my medical decision-making.   EKG Interpretation   Date/Time:  Saturday February 06 2016 14:21:23 EDT Ventricular Rate:  89 PR Interval:    QRS Duration: 89 QT Interval:  370 QTC Calculation: 451 R Axis:   73 Text Interpretation:  Sinus rhythm Probable left atrial enlargement  Probable left ventricular hypertrophy no acute ischemia noted on ekg  Confirmed by Kandis Mannan ([id number]) on 02/06/2016 2:25:44 PM      MDM   Final diagnoses:  None   Patient is a pleasant 74 yea rol female presenting with nausea today after being in the heat. She has had this in the past. No chest pain, no LOC, no dizziness, just lightheaded for moments.  Better after being in the air conditioning.  EKG is normal, I don't see anyh acute ischemia.    No syncope no chest pain.  Nausea attack likely from heat/dehydration.   WIll give fluids.    Maisyn Nouri Randall An, MD 02/06/16 1431

## 2016-02-06 NOTE — Progress Notes (Addendum)
By signing my name below, I, [name], attest that this documentation has been prepared under the direction and in the presence of [name], MD.  Electronically Signed: Arvilla Market, Medical Scribe. 02/06/2016. 12:20 PM.  Subjective:    Patient ID: Amanda Swanson, female    DOB: 12/27/1950, 65 y.o.   MRN: [id number]  HPI Chief Complaint  Patient presents with  . HEAT EXHAUSTION    HPI Comments: Amanda Swanson is a 65 y.o. female with a PMHx of HTN, HLD, palpitation (with evaluation of cardiology last year she had an exercise stress test Aug 2016 that was negative), and anxiety who presents to the [facility] complaining of heat exhaustion onset 1.5 hours ago. I last saw her June 2016. Pt was in and out the car, walking door to door outside in the heat for about 5 mins in between each door. Pt reports nausea, vomiting 3-4 times, light-headedness, chest discomfort which occurred because she was anxious earlier, and head congestion. Pt states her symptoms are making her weak. Pt drank 14 oz of cold water for relief to her symptoms. Pt states she has vertigo- she can't lay down too low. Pt has multiple chemical sensitivity and is concerned about taking medication. Pt states she doesn't normally get nauseous when she lays down. Pt reports she normally gets dizzy at the slightest movement while laying down. Pt would like to get her blood sugar checked today. Pt denies diaphoresis, chest pain, blurry vision, slurry speech, trouble making words, and difficulty urinating.  Patient Active Problem List   Diagnosis Date Noted  . Allergic rhinitis 04/14/2015  . Food allergy 04/14/2015  . Oral allergy syndrome 04/14/2015  . Palpitations 04/03/2013  . hyperlipidemia 02/10/2012  . Anxiety 08/26/2011  . HTN (hypertension) 08/26/2011   Past Medical History  Diagnosis Date  . Allergy   . Cataract   . Heart murmur   . Hypertension   . Anxiety   . IBS (irritable  bowel syndrome)    Past Surgical History  Procedure Laterality Date  . Breast surgery     Allergies  Allergen Reactions  . Other Swelling    All Nuts  . Shellfish Allergy     Allergy determined by allergy test - patient does not eat   Prior to Admission medications   Medication Sig Start Date End Date Taking? Authorizing Provider  clonazePAM (KLONOPIN) 0.5 MG tablet Take 1 tablet (0.5 mg total) by mouth 2 (two) times daily as needed for anxiety. Takes 1/2 pill prn. 06/10/15  Yes Tonye Pearson, MD  EPINEPHrine (EPIPEN 2-PAK) 0.3 mg/0.3 mL IJ SOAJ injection Inject 0.3 mLs (0.3 mg total) into the muscle once. 04/10/15  Yes Dorna Leitz, PA-C  Multiple Vitamin (MULTIVITAMIN) tablet Take by mouth daily. Takes 1/2 daily.   Yes Historical Provider, MD  nebivolol (BYSTOLIC) 10 MG tablet TAKE 1 TABLET (10 MG TOTAL) BY MOUTH DAILY 06/10/15  Yes Tonye Pearson, MD  montelukast (SINGULAIR) 10 MG tablet Take 1 tablet (10 mg total) by mouth at bedtime. Patient not taking: Reported on 02/06/2016 04/08/15   Collene Gobble, MD   Social History   Social History  . Marital Status: Married    Spouse Name: N/A  . Number of Children: N/A  . Years of Education: N/A   Occupational History  . Not on file.   Social History Main Topics  . Smoking status: Never Smoker   . Smokeless tobacco: Never Used  .  Alcohol Use: No  . Drug Use: No  . Sexual Activity: Yes   Other Topics Concern  . Not on file   Social History Narrative   4 pregnancies   3 live births   1 miscarriage   Review of Systems  Constitutional: Negative for diaphoresis.  HENT: Positive for congestion.   Eyes: Negative for visual disturbance.  Cardiovascular: Negative for chest pain.  Gastrointestinal: Positive for nausea and vomiting.  Genitourinary: Negative for difficulty urinating.  Neurological: Positive for weakness and light-headedness. Negative for speech difficulty.  Psychiatric/Behavioral: The patient is  nervous/anxious.     Objective:  BP 140/92 mmHg  Pulse 92  Temp(Src) 97.7 F (36.5 C) (Oral)  Resp 18  Ht  (1.702 m)  Wt 163 lb (73.936 kg)  BMI 25.52 kg/m2  SpO2 98%  Physical Exam  Constitutional: She appears well-developed and well-nourished. No distress.  HENT:  Head: Normocephalic and atraumatic.  Eyes: Conjunctivae are normal. Pupils are equal, round, and reactive to light. Left eye exhibits no nystagmus.  Neck: Neck supple.  Cardiovascular: Normal rate.   Pulmonary/Chest: Effort normal.  Neurological: She is alert.  No pronator drift No focal weakness No face droop Equal strength  Skin: Skin is warm and dry.  Psychiatric: She has a normal mood and affect. Her behavior is normal.  Nursing note and vitals reviewed.  EKG Reading: Sinus rhythm. ST depression V3-V6, also seen on EKG June 2016, but appears to be slightly more prominent.  Results for orders placed or performed in visit on 02/06/16  POCT CBC  Result Value Ref Range   WBC 10.1 4.6 - 10.2 K/uL   Lymph, poc 2.2 0.6 - 3.4   POC LYMPH PERCENT 22.1 10 - 50 %L   MID (cbc) 0.2 0 - 0.9   POC MID % 1.9 0 - 12 %M   POC Granulocyte 7.7 (A) 2 - 6.9   Granulocyte percent 76.0 37 - 80 %G   RBC 5.18 4.04 - 5.48 M/uL   Hemoglobin 14.6 12.2 - 16.2 g/dL   HCT, POC 69.6 29.5 - 47.9 %   MCV 81.0 80 - 97 fL   MCH, POC 28.3 27 - 31.2 pg   MCHC 34.9 31.8 - 35.4 g/dL   RDW, POC 28.4 %   Platelet Count, POC 152 142 - 424 K/uL   MPV 10.6 0 - 99.8 fL  POCT glucose (manual entry)  Result Value Ref Range   POC Glucose 110 (A) 70 - 99 mg/dl    Results for orders placed or performed in visit on 02/06/16  POCT CBC  Result Value Ref Range   WBC 10.1 4.6 - 10.2 K/uL   Lymph, poc 2.2 0.6 - 3.4   POC LYMPH PERCENT 22.1 10 - 50 %L   MID (cbc) 0.2 0 - 0.9   POC MID % 1.9 0 - 12 %M   POC Granulocyte 7.7 (A) 2 - 6.9   Granulocyte percent 76.0 37 - 80 %G   RBC 5.18 4.04 - 5.48 M/uL   Hemoglobin 14.6 12.2 - 16.2 g/dL    HCT, POC 13.2 44.0 - 47.9 %   MCV 81.0 80 - 97 fL   MCH, POC 28.3 27 - 31.2 pg   MCHC 34.9 31.8 - 35.4 g/dL   RDW, POC 10.2 %   Platelet Count, POC 152 142 - 424 K/uL   MPV 10.6 0 - 99.8 fL  POCT glucose (manual entry)  Result Value Ref Range  POC Glucose 110 (A) 70 - 99 mg/dl    Assessment & Plan:   Sevyn Markham is a 65 y.o. female Non-intractable vomiting with nausea, vomiting of unspecified type - Plan: ondansetron (ZOFRAN-ODT) disintegrating tablet 4 mg  Lightheadedness - Plan: POCT CBC, POCT glucose (manual entry), COMPLETE METABOLIC PANEL WITH GFR  Chest discomfort - Plan: EKG 12-Lead  Dizziness - Plan: POCT CBC, POCT glucose (manual entry), COMPLETE METABOLIC PANEL WITH GFR  Acute onset of nausea, dizziness, vomiting with minimal chest discomfort this morning. Had been in and out of the car, into the heat, sepsis suspected possible heat illness, but had not been out and he for significant amount of time. Nonspecific ST depression on EKG, seen previously, but possibly slightly worse than previous EKG. Some slight improvement in symptoms with IV fluid, Zofran 4 mg by mouth.  -With abnormal EKG, and acute onset of symptoms, will have evaluated further through emergency room. EMS called for transport at approximately 1:15 PM, charge nurse at [facility] ER advised.  Over 40 minutes of care provided.  1:30 PM Report given to EMS with transfer of care.   Meds ordered this encounter  Medications  . ondansetron (ZOFRAN-ODT) disintegrating tablet 4 mg    Sig:    Patient Instructions       IF you received an x-ray today, you will receive an invoice from [facility]. Please contact [facility] at [phone] with questions or concerns regarding your invoice.   IF you received labwork today, you will receive an invoice from [facility]. Please contact [facility] at [phone] with questions or concerns regarding your invoice.    Our billing staff will not be able to assist you with questions regarding bills from these companies.  You will be contacted with the lab results as soon as they are available. The fastest way to get your results is to activate your My Chart account. Instructions are located on the last page of this paperwork. If you have not heard from [place] regarding the results in 2 weeks, please contact this office.        IF you received an x-ray today, you will receive an invoice from [facility]. Please contact [facility] at [phone] with questions or concerns regarding your invoice.   IF you received labwork today, you will receive an invoice from [employer] [facility]. Please contact [facility] at [phone] with questions or concerns regarding your invoice.   Our billing staff will not be able to assist you with questions regarding bills from these companies.  You will be contacted with the lab results as soon as they are available. The fastest way to get your results is to activate your My Chart account. Instructions are located on the last page of this paperwork. If you have not heard from [place] regarding the results in 2 weeks, please contact this office.      I personally performed the services described in this documentation, which was scribed in my presence. The recorded information has been reviewed and considered, and addended by me as needed.   Signed,   [name], MD [facility].  02/06/2016 1:30 PM

## 2016-02-06 NOTE — ED Notes (Signed)
Declined W/C at D/C and was escorted to lobby by RN. 

## 2016-02-06 NOTE — Patient Instructions (Addendum)
     IF you received an x-ray today, you will receive an invoice from [facility]. Please contact [facility] at [phone] with questions or concerns regarding your invoice.   IF you received labwork today, you will receive an invoice from [facility]. Please contact [facility] at [phone] with questions or concerns regarding your invoice.   Our billing staff will not be able to assist you with questions regarding bills from these companies.  You will be contacted with the lab results as soon as they are available. The fastest way to get your results is to activate your My Chart account. Instructions are located on the last page of this paperwork. If you have not heard from us regarding the results in 2 weeks, please contact this office.         IF you received an x-ray today, you will receive an invoice from [facility]. Please contact [facility] at [phone] with questions or concerns regarding your invoice.   IF you received labwork today, you will receive an invoice from [facility]. Please contact [facility] at [phone] with questions or concerns regarding your invoice.   Our billing staff will not be able to assist you with questions regarding bills from these companies.  You will be contacted with the lab results as soon as they are available. The fastest way to get your results is to activate your My Chart account. Instructions are located on the last page of this paperwork. If you have not heard from us regarding the results in 2 weeks, please contact this office.     

## 2016-02-06 NOTE — Discharge Instructions (Signed)
Please follow up with your regular physician.  Dehydration, Adult Dehydration is a condition in which you do not have enough fluid or water in your body. It happens when you take in less fluid than you lose. Vital organs such as the kidneys, brain, and heart cannot function without a proper amount of fluids. Any loss of fluids from the body can cause dehydration.  Dehydration can range from mild to severe. This condition should be treated right away to help prevent it from becoming severe. CAUSES  This condition may be caused by:  Vomiting.  Diarrhea.  Excessive sweating, such as when exercising in hot or humid weather.  Not drinking enough fluid during strenuous exercise or during an illness.  Excessive urine output.  Fever.  Certain medicines. RISK FACTORS This condition is more likely to develop in:  People who are taking certain medicines that cause the body to lose excess fluid (diuretics).   People who have a chronic illness, such as diabetes, that may increase urination.  Older adults.   People who live at high altitudes.   People who participate in endurance sports.  SYMPTOMS  Mild Dehydration  Thirst.  Dry lips.  Slightly dry mouth.  Dry, warm skin. Moderate Dehydration  Very dry mouth.   Muscle cramps.   Dark urine and decreased urine production.   Decreased tear production.   Headache.   Light-headedness, especially when you stand up from a sitting position.  Severe Dehydration  Changes in skin.   Cold and clammy skin.   Skin does not spring back quickly when lightly pinched and released.   Changes in body fluids.   Extreme thirst.   No tears.   Not able to sweat when body temperature is high, such as in hot weather.   Minimal urine production.   Changes in vital signs.   Rapid, weak pulse (more than 100 beats per minute when you are sitting still).   Rapid breathing.   Low blood pressure.   Other  changes.   Sunken eyes.   Cold hands and feet.   Confusion.  Lethargy and difficulty being awakened.  Fainting (syncope).   Short-term weight loss.   Unconsciousness. DIAGNOSIS  This condition may be diagnosed based on your symptoms. You may also have tests to determine how severe your dehydration is. These tests may include:   Urine tests.   Blood tests.  TREATMENT  Treatment for this condition depends on the severity. Mild or moderate dehydration can often be treated at home. Treatment should be started right away. Do not wait until dehydration becomes severe. Severe dehydration needs to be treated at the hospital. Treatment for Mild Dehydration  Drinking plenty of water to replace the fluid you have lost.   Replacing minerals in your blood (electrolytes) that you may have lost.  Treatment for Moderate Dehydration  Consuming oral rehydration solution (ORS). Treatment for Severe Dehydration  Receiving fluid through an IV tube.   Receiving electrolyte solution through a feeding tube that is passed through your nose and into your stomach (nasogastric tube or NG tube).  Correcting any abnormalities in electrolytes. HOME CARE INSTRUCTIONS   Drink enough fluid to keep your urine clear or pale yellow.   Drink water or fluid slowly by taking small sips. You can also try sucking on ice cubes.  Have food or beverages that contain electrolytes. Examples include bananas and sports drinks.  Take over-the-counter and prescription medicines only as told by your health care provider.   Prepare  ORS according to the manufacturer's instructions. Take sips of ORS every 5 minutes until your urine returns to normal.  If you have vomiting or diarrhea, continue to try to drink water, ORS, or both.   If you have diarrhea, avoid:   Beverages that contain caffeine.   Fruit juice.   Milk.   Carbonated soft drinks.  Do not take salt tablets. This can lead to the  condition of having too much sodium in your body (hypernatremia).  SEEK MEDICAL CARE IF:  You cannot eat or drink without vomiting.  You have had moderate diarrhea during a period of more than 24 hours.  You have a fever. SEEK IMMEDIATE MEDICAL CARE IF:   You have extreme thirst.  You have severe diarrhea.  You have not urinated in 6-8 hours, or you have urinated only a small amount of very dark urine.  You have shriveled skin.  You are dizzy, confused, or both.   This information is not intended to replace advice given to you by your health care provider. Make sure you discuss any questions you have with your health care provider.   Document Released: 07/04/2005 Document Revised: 03/25/2015 Document Reviewed: 11/19/2014 Elsevier Interactive Patient Education [employer]

## 2016-05-06 ENCOUNTER — Ambulatory Visit
Admission: RE | Admit: 2016-05-06 | Discharge: 2016-05-06 | Disposition: A | Discharge status: Home / Self Care | Payer: BC Managed Care – PPO | Source: Ambulatory Visit | Attending: Endocrinology | Admitting: Endocrinology

## 2016-05-06 DIAGNOSIS — E049 Nontoxic goiter, unspecified: Secondary | ICD-10-CM

## 2016-12-30 ENCOUNTER — Encounter: Payer: Self-pay | Admitting: Obstetrics & Gynecology

## 2017-02-17 ENCOUNTER — Ambulatory Visit (INDEPENDENT_AMBULATORY_CARE_PROVIDER_SITE_OTHER): Payer: Medicare Other | Admitting: Family Medicine

## 2017-02-17 ENCOUNTER — Encounter: Payer: Self-pay | Admitting: Family Medicine

## 2017-02-17 VITALS — BP 172/90 | HR 81 | Temp 98.4°F | Resp 16 | Ht 65.5 in | Wt 157.6 lb

## 2017-02-17 DIAGNOSIS — I10 Essential (primary) hypertension: Secondary | ICD-10-CM

## 2017-02-17 DIAGNOSIS — K581 Irritable bowel syndrome with constipation: Secondary | ICD-10-CM

## 2017-02-17 DIAGNOSIS — F419 Anxiety disorder, unspecified: Secondary | ICD-10-CM

## 2017-02-17 DIAGNOSIS — R002 Palpitations: Secondary | ICD-10-CM | POA: Diagnosis not present

## 2017-02-17 MED ORDER — GLUCOSE BLOOD VI STRP: ORAL_STRIP | 12 refills | Status: DC

## 2017-02-17 MED ORDER — CLONAZEPAM 0.5 MG PO TABS: 0.5000 mg | ORAL_TABLET | Freq: Two times a day (BID) | ORAL | 3 refills | Status: DC | PRN

## 2017-02-17 NOTE — Addendum Note (Signed)
Addended by: Collie SiadSTALLINGS, Evangelina Delancey A on: 02/17/2017 02:42 PM   Modules accepted: Orders

## 2017-02-17 NOTE — Progress Notes (Signed)
Chief Complaint  Patient presents with  . Medication Refill    Clonazepam 0.5 mg    HPI Anxiety and IBS Patient reports that she has not been sleeping and is dealing with chronic fatigue She reports that she has only been sleeping 3 hours at night She has been getting anxiety and IBS She reports that she does not take daily medications for her anxiety   GAD 7 : Generalized Anxiety Score 02/17/2017  Nervous, Anxious, on Edge 1  Control/stop worrying 1  Worry too much - different things 3  Trouble relaxing 1  Restless 0  Easily annoyed or irritable 2  Afraid - awful might happen 1  Total GAD 7 Score 9  Anxiety Difficulty Somewhat difficult    Hypertension with white coat syndrome She reports that her home bp readings are 120/70s She states that she has not been sleeping which has made he cry which is difficult for her now She states that she has been crying as well which is making her bp high She denies chest pain and palpitations  IBS with constipation She reports that she has a history of IBS with constipation most times but gets diarrhea when she has to leave the house She has a disease that is linked to   Past Medical History:  Diagnosis Date  . Allergy   . Anxiety   . Cataract   . Heart murmur   . Hypertension   . IBS (irritable bowel syndrome)     Current Outpatient Prescriptions  Medication Sig Dispense Refill  . clonazePAM (KLONOPIN) 0.5 MG tablet Take 1 tablet (0.5 mg total) by mouth 2 (two) times daily as needed for anxiety. Takes 1/2 pill prn. 30 tablet 3  . EPINEPHrine (EPIPEN 2-PAK) 0.3 mg/0.3 mL IJ SOAJ injection Inject 0.3 mLs (0.3 mg total) into the muscle once. 1 Device 1  . Multiple Vitamin (MULTIVITAMIN) tablet Take by mouth daily. Takes 1/2 daily.    . nebivolol (BYSTOLIC) 10 MG tablet TAKE 1 TABLET (10 MG TOTAL) BY MOUTH DAILY 90 tablet 3   No current facility-administered medications for this visit.     Allergies:  Allergies  Allergen  Reactions  . Other Swelling    All Nuts  . Shellfish Allergy     Allergy determined by allergy test - patient does not eat  . Benadryl [Diphenhydramine Hcl (Sleep)] Palpitations  . Singulair [Montelukast Sodium] Palpitations    Past Surgical History:  Procedure Laterality Date  . BREAST SURGERY      Social History   Social History  . Marital status: Married    Spouse name: N/A  . Number of children: N/A  . Years of education: N/A   Social History Main Topics  . Smoking status: Never Smoker  . Smokeless tobacco: Never Used  . Alcohol use No  . Drug use: No  . Sexual activity: Yes   Other Topics Concern  . None   Social History Narrative   4 pregnancies   3 live births   1 miscarriage    ROS See hpi  Objective: Vitals:   02/17/17 1047  BP: (!) 172/90  Pulse: 81  Resp: 16  Temp: 98.4 F (36.9 C)  TempSrc: Oral  SpO2: 98%  Weight: 157 lb 9.6 oz (71.5 kg)  Height: 5' 5.5" (1.664 m)   BP Readings from Last 3 Encounters:  02/17/17 (!) 172/90  02/06/16 148/82  02/06/16 (!) 140/92     Physical Exam Physical Exam  Constitutional: She is  oriented to person, place, and time. She appears well-developed and well-nourished.  HENT:  Head: Normocephalic and atraumatic.  Eyes: Conjunctivae and EOM are normal.  Cardiovascular: Normal rate, regular rhythm and normal heart sounds.   Pulmonary/Chest: Effort normal and breath sounds normal. No respiratory distress. She has no wheezes.  Abdominal: Normal appearance and bowel sounds are normal. There is no tenderness. There is no CVA tenderness.  Neurological: She is alert and oriented to person, place, and time.     Assessment and Plan Amanda JunglingMarcia was seen today for medication refill.  Diagnoses and all orders for this visit:  Irritable bowel syndrome with constipation- continue to manage anxiety which makes IBS worse Since she mostly gets constipation then bentyl won't help  Anxiety- discussed SSRIs but given  pt's heart issues, palpitations, bowel issues, allergies and chemical sensitivity will use clonazepam  -     clonazePAM (KLONOPIN) 0.5 MG tablet; Take 1 tablet (0.5 mg total) by mouth 2 (two) times daily as needed for anxiety. Takes 1/2 pill prn.  Palpitations- continue beta blocker  Essential hypertension- bp uncontrolled presently but her previous trend was good Pt to return when she is feeling better for bp check     Deyci Gesell A Creta LevinStallings

## 2017-02-17 NOTE — Patient Instructions (Addendum)
   IF you received an x-ray today, you will receive an invoice from [facility]. Please contact  Radiology at [phone] with questions or concerns regarding your invoice.   IF you received labwork today, you will receive an invoice from [facility]. Please contact [facility] at [phone] with questions or concerns regarding your invoice.   Our billing staff will not be able to assist you with questions regarding bills from these companies.  You will be contacted with the lab results as soon as they are available. The fastest way to get your results is to activate your My Chart account. Instructions are located on the last page of this paperwork. If you have not heard from us regarding the results in 2 weeks, please contact this office.     Living With Anxiety After being diagnosed with an anxiety disorder, you may be relieved to know why you have felt or behaved a certain way. It is natural to also feel overwhelmed about the treatment ahead and what it will mean for your life. With care and support, you can manage this condition and recover from it. How to cope with anxiety Dealing with stress Stress is your body's reaction to life changes and events, both good and bad. Stress can last just a few hours or it can be ongoing. Stress can play a major role in anxiety, so it is important to learn both how to cope with stress and how to think about it differently. Talk with your health care provider or a counselor to learn more about stress reduction. He or she may suggest some stress reduction techniques, such as:  Music therapy. This can include creating or listening to music that you enjoy and that inspires you.  Mindfulness-based meditation. This involves being aware of your normal breaths, rather than trying to control your breathing. It can be done while sitting or walking.  Centering prayer. This is a kind of meditation that involves focusing on a word, phrase, or  sacred image that is meaningful to you and that brings you peace.  Deep breathing. To do this, expand your stomach and inhale slowly through your nose. Hold your breath for 3-5 seconds. Then exhale slowly, allowing your stomach muscles to relax.  Self-talk. This is a skill where you identify thought patterns that lead to anxiety reactions and correct those thoughts.  Muscle relaxation. This involves tensing muscles then relaxing them.  Choose a stress reduction technique that fits your lifestyle and personality. Stress reduction techniques take time and practice. Set aside 5-15 minutes a day to do them. Therapists can offer training in these techniques. The training may be covered by some insurance plans. Other things you can do to manage stress include:  Keeping a stress diary. This can help you learn what triggers your stress and ways to control your response.  Thinking about how you respond to certain situations. You may not be able to control everything, but you can control your reaction.  Making time for activities that help you relax, and not feeling guilty about spending your time in this way.  Therapy combined with coping and stress-reduction skills provides the best chance for successful treatment. Medicines Medicines can help ease symptoms. Medicines for anxiety include:  Anti-anxiety drugs.  Antidepressants.  Beta-blockers.  Medicines may be used as the main treatment for anxiety disorder, along with therapy, or if other treatments are not working. Medicines should be prescribed by a health care provider. Relationships Relationships can play a big part in   helping you recover. Try to spend more time connecting with trusted friends and family members. Consider going to couples counseling, taking family education classes, or going to family therapy. Therapy can help you and others better understand the condition. How to recognize changes in your condition Everyone has a  different response to treatment for anxiety. Recovery from anxiety happens when symptoms decrease and stop interfering with your daily activities at home or work. This may mean that you will start to:  Have better concentration and focus.  Sleep better.  Be less irritable.  Have more energy.  Have improved memory.  It is important to recognize when your condition is getting worse. Contact your health care provider if your symptoms interfere with home or work and you do not feel like your condition is improving. Where to find help and support: You can get help and support from these sources:  Self-help groups.  Online and community organizations.  A trusted spiritual leader.  Couples counseling.  Family education classes.  Family therapy.  Follow these instructions at home:  Eat a healthy diet that includes plenty of vegetables, fruits, whole grains, low-fat dairy products, and lean protein. Do not eat a lot of foods that are high in solid fats, added sugars, or salt.  Exercise. Most adults should do the following: ? Exercise for at least 150 minutes each week. The exercise should increase your heart rate and make you sweat (moderate-intensity exercise). ? Strengthening exercises at least twice a week.  Cut down on caffeine, tobacco, alcohol, and other potentially harmful substances.  Get the right amount and quality of sleep. Most adults need 7-9 hours of sleep each night.  Make choices that simplify your life.  Take over-the-counter and prescription medicines only as told by your health care provider.  Avoid caffeine, alcohol, and certain over-the-counter cold medicines. These may make you feel worse. Ask your pharmacist which medicines to avoid.  Keep all follow-up visits as told by your health care provider. This is important. Questions to ask your health care provider  Would I benefit from therapy?  How often should I follow up with a health care  provider?  How long do I need to take medicine?  Are there any long-term side effects of my medicine?  Are there any alternatives to taking medicine? Contact a health care provider if:  You have a hard time staying focused or finishing daily tasks.  You spend many hours a day feeling worried about everyday life.  You become exhausted by worry.  You start to have headaches, feel tense, or have nausea.  You urinate more than normal.  You have diarrhea. Get help right away if:  You have a racing heart and shortness of breath.  You have thoughts of hurting yourself or others. If you ever feel like you may hurt yourself or others, or have thoughts about taking your own life, get help right away. You can go to your nearest emergency department or call:  Your local emergency services (911 in the [place]  A suicide crisis helpline, such as the National Suicide Prevention Lifeline at 1-800-273-8255. This is open 24-hours a day.  Summary  Taking steps to deal with stress can help calm you.  Medicines cannot cure anxiety disorders, but they can help ease symptoms.  Family, friends, and partners can play a big part in helping you recover from an anxiety disorder. This information is not intended to replace advice given to you by your health care provider. Make   sure you discuss any questions you have with your health care provider. Document Released: 06/28/2016 Document Revised: 06/28/2016 Document Reviewed: 06/28/2016 Elsevier Interactive Patient Education  2018 Elsevier Inc.  

## 2017-03-03 DIAGNOSIS — H10413 Chronic giant papillary conjunctivitis, bilateral: Secondary | ICD-10-CM | POA: Diagnosis not present

## 2017-07-03 ENCOUNTER — Ambulatory Visit: Payer: Medicare Other | Admitting: Physician Assistant

## 2017-07-03 ENCOUNTER — Encounter: Payer: Self-pay | Admitting: Physician Assistant

## 2017-07-03 ENCOUNTER — Other Ambulatory Visit: Payer: Self-pay

## 2017-07-03 VITALS — BP 168/86 | HR 78 | Temp 98.3°F | Resp 16 | Ht 65.0 in | Wt 164.0 lb

## 2017-07-03 DIAGNOSIS — I10 Essential (primary) hypertension: Secondary | ICD-10-CM | POA: Diagnosis not present

## 2017-07-03 DIAGNOSIS — F419 Anxiety disorder, unspecified: Secondary | ICD-10-CM | POA: Diagnosis not present

## 2017-07-03 DIAGNOSIS — Z13228 Encounter for screening for other metabolic disorders: Secondary | ICD-10-CM | POA: Diagnosis not present

## 2017-07-03 DIAGNOSIS — T7819XA Other adverse food reactions, not elsewhere classified, initial encounter: Secondary | ICD-10-CM

## 2017-07-03 DIAGNOSIS — Z0000 Encounter for general adult medical examination without abnormal findings: Secondary | ICD-10-CM

## 2017-07-03 DIAGNOSIS — Z130 Encounter for screening for diseases of the blood and blood-forming organs and certain disorders involving the immune mechanism: Secondary | ICD-10-CM

## 2017-07-03 DIAGNOSIS — T7840XA Allergy, unspecified, initial encounter: Secondary | ICD-10-CM | POA: Diagnosis not present

## 2017-07-03 DIAGNOSIS — T781XXA Other adverse food reactions, not elsewhere classified, initial encounter: Secondary | ICD-10-CM

## 2017-07-03 MED ORDER — EPINEPHRINE 0.3 MG/0.3ML IJ SOAJ: 0.3000 mg | Freq: Once | INTRAMUSCULAR | 1 refills | Status: AC

## 2017-07-03 MED ORDER — NEBIVOLOL HCL 5 MG PO TABS: 5.0000 mg | ORAL_TABLET | Freq: Every day | ORAL | 3 refills | Status: DC

## 2017-07-03 NOTE — Progress Notes (Signed)
PRIMARY CARE AT [facility] [address], [place] [address] [phone]  Date:  07/03/2017   Name:  Amanda Swanson   DOB:  19-Dec-1950   MRN:  [id number]  PCP:  Patient, No Pcp Per    History of Present Illness:  Amanda Swanson is a 66 y.o. female patient who presents to PCP with  Chief Complaint  Patient presents with  . Annual Exam     DIET: no pork or red meats.  No coffee intake.  Eating vegetables.  More starch lately.   BM: constipation.  No black or bloody stool.  No stool softeners at this time.      URINATION: frequency, waking her up at night.  She has attempted to stop   SLEEP not sleeping well at night  SOCIAL ACTIVITY:  Witnessing to people  Nutritionist: she would like to see a nutritionist to help her with her dieting.  She feels that she has to eat consistently.     Patient Active Problem List   Diagnosis Date Noted  . Allergic rhinitis 04/14/2015  . Food allergy 04/14/2015  . Oral allergy syndrome 04/14/2015  . Palpitations 04/03/2013  . hyperlipidemia 02/10/2012  . Anxiety 08/26/2011  . HTN (hypertension) 08/26/2011    Past Medical History:  Diagnosis Date  . Allergy   . Anxiety   . Cataract   . Heart murmur   . Hypertension   . IBS (irritable bowel syndrome)     Past Surgical History:  Procedure Laterality Date  . BREAST SURGERY      Social History   Tobacco Use  . Smoking status: Never Smoker  . Smokeless tobacco: Never Used  Substance Use Topics  . Alcohol use: No    Alcohol/week: 0.0 oz  . Drug use: No    Family History  Problem Relation Age of Onset  . Heart disease Father        MI  . Cancer Sister        breast  . Diabetes Sister        also hypertension   . Diabetes Brother   . Hypertension Brother        x3  . Cancer Mother   . Diabetes Brother     Allergies  Allergen Reactions  . Other Swelling    All Nuts  . Shellfish Allergy     Allergy determined by allergy test - patient does not eat  .  Benadryl [Diphenhydramine Hcl (Sleep)] Palpitations  . Singulair [Montelukast Sodium] Palpitations    Medication list has been reviewed and updated.  Current Outpatient Medications on File Prior to Visit  Medication Sig Dispense Refill  . clonazePAM (KLONOPIN) 0.5 MG tablet Take 1 tablet (0.5 mg total) by mouth 2 (two) times daily as needed for anxiety. Takes 1/2 pill prn. 30 tablet 3  . EPINEPHrine (EPIPEN 2-PAK) 0.3 mg/0.3 mL IJ SOAJ injection Inject 0.3 mLs (0.3 mg total) into the muscle once. 1 Device 1  . glucose blood test strip To test blood glucose daily. Code R73.03 100 each 12  . Multiple Vitamin (MULTIVITAMIN) tablet Take by mouth daily. Takes 1/2 daily.    . nebivolol (BYSTOLIC) 10 MG tablet TAKE 1 TABLET (10 MG TOTAL) BY MOUTH DAILY 90 tablet 3   No current facility-administered medications on file prior to visit.     ROS ROS otherwise unremarkable unless listed above.  Physical Examination: BP (!) 168/86   Pulse 78   Temp 98.3 F (36.8 C) (  Oral)   Resp 16   Ht 5' 5"  (1.651 m)   Wt 164 lb (74.4 kg)   SpO2 99%   BMI 27.29 kg/m  Ideal Body Weight: Weight in (lb) to have BMI = 25: 149.9  Physical Exam  Constitutional: She is oriented to person, place, and time. She appears well-developed and well-nourished. No distress.  HENT:  Head: Normocephalic and atraumatic.  Right Ear: Tympanic membrane, external ear and ear canal normal.  Left Ear: Tympanic membrane, external ear and ear canal normal.  Nose: Right sinus exhibits no maxillary sinus tenderness and no frontal sinus tenderness. Left sinus exhibits no maxillary sinus tenderness and no frontal sinus tenderness.  Mouth/Throat: Oropharynx is clear and moist. No uvula swelling. No oropharyngeal exudate, posterior oropharyngeal edema or posterior oropharyngeal erythema.  Eyes: Conjunctivae and EOM are normal. Pupils are equal, round, and reactive to light.  Neck: Normal range of motion. Neck supple. No thyromegaly  present.  Cardiovascular: Normal rate, regular rhythm, normal heart sounds and intact distal pulses. Exam reveals no gallop, no distant heart sounds and no friction rub.  No murmur heard. Pulmonary/Chest: Effort normal and breath sounds normal. No respiratory distress. She has no decreased breath sounds. She has no wheezes. She has no rhonchi.  Abdominal: Soft. Bowel sounds are normal. She exhibits no distension and no mass. There is no tenderness.  Musculoskeletal: Normal range of motion. She exhibits no edema or tenderness.  Lymphadenopathy:       Head (right side): No submandibular, no tonsillar, no preauricular and no posterior auricular adenopathy present.       Head (left side): No submandibular, no tonsillar, no preauricular and no posterior auricular adenopathy present.    She has no cervical adenopathy.  Neurological: She is alert and oriented to person, place, and time. No cranial nerve deficit. She exhibits normal muscle tone. Coordination normal.  Skin: Skin is warm and dry. She is not diaphoretic.  Psychiatric: She has a normal mood and affect. Her behavior is normal.   Assessment and Plan: Amanda Swanson is a 66 y.o. female who is here today for cc of  Chief Complaint  Patient presents with  . Annual Exam  --refilling the bystolic at 90m.  She is taking half tablet.  I do think she may need an added bp medicine.  Advised her to monitor.  She endorses a white coat syndrome.   --advised to take the full clonazepam .565mnightly to see if this may help with her symptoms.   --labs obtained below.  Follow up with cardiology in regards Annual physical exam - Plan: CBC, CMP14+EGFR  Screening for metabolic disorder - Plan: CMP14+EGFR  Screening for deficiency anemia - Plan: CBC  Essential hypertension - Plan: nebivolol (BYSTOLIC) 5 MG tablet  Anxiety - Plan: Ambulatory referral to Nutrition and Diabetic Education  Allergic reaction, initial encounter - Plan: EPINEPHrine  (EPIPEN 2-PAK) 0.3 mg/0.3 mL IJ SOAJ injection, Ambulatory referral to Nutrition and Diabetic Education  Hypersensitivity reaction due to food - Plan: Ambulatory referral to Nutrition and Diabetic Education  StIvar DrapePA-C [facility] 12/26/201810:02 PM

## 2017-07-03 NOTE — Patient Instructions (Addendum)
Keeping You Healthy  Get These Tests  Blood Pressure- Have your blood pressure checked by your healthcare provider at least once a year.  Normal blood pressure is 120/80.  Weight- Have your body mass index (BMI) calculated to screen for obesity.  BMI is a measure of body fat based on height and weight.  You can calculate your own BMI at www.nhlbisupport.com/bmi/  Cholesterol- Have your cholesterol checked every year.  Diabetes- Have your blood sugar checked every year if you have high blood pressure, high cholesterol, a family history of diabetes or if you are overweight.  Pap Test - Have a pap test every 1 to 5 years if you have been sexually active.  If you are older than 65 and recent pap tests have been normal you may not need additional pap tests.  In addition, if you have had a hysterectomy  for benign disease additional pap tests are not necessary.  Mammogram-Yearly mammograms are essential for early detection of breast cancer  Screening for Colon Cancer- Colonoscopy starting at age 50. Screening may begin sooner depending on your family history and other health conditions.  Follow up colonoscopy as directed by your Gastroenterologist.  Screening for Osteoporosis- Screening begins at age 65 with bone density scanning, sooner if you are at higher risk for developing Osteoporosis.  Get these medicines  Calcium with Vitamin D- Your body requires 1200-1500 mg of Calcium a day and 800-1000 IU of Vitamin D a day.  You can only absorb 500 mg of Calcium at a time therefore Calcium must be taken in 2 or 3 separate doses throughout the day.  Hormones- Hormone therapy has been associated with increased risk for certain cancers and heart disease.  Talk to your healthcare provider about if you need relief from menopausal symptoms.  Aspirin- Ask your healthcare provider about taking Aspirin to prevent Heart Disease and Stroke.  Get these Immuniztions  Flu shot- Every fall  Pneumonia shot-  Once after the age of 65; if you are younger ask your healthcare provider if you need a pneumonia shot.  Tetanus- Every ten years.  Zostavax- Once after the age of 60 to prevent shingles.  Take these steps  Don't smoke- Your healthcare provider can help you quit. For tips on how to quit, ask your healthcare provider or go to www.smokefree.gov or call 1-800 QUIT-NOW.  Be physically active- Exercise 5 days a week for a minimum of 30 minutes.  If you are not already physically active, start slow and gradually work up to 30 minutes of moderate physical activity.  Try walking, dancing, bike riding, swimming, etc.  Eat a healthy diet- Eat a variety of healthy foods such as fruits, vegetables, whole grains, low fat milk, low fat cheeses, yogurt, lean meats, chicken, fish, eggs, dried beans, tofu, etc.  For more information go to www.thenutritionsource.org  Dental visit- Brush and floss teeth twice daily; visit your dentist twice a year.  Eye exam- Visit your Optometrist or Ophthalmologist yearly.  Drink alcohol in moderation- Limit alcohol intake to one drink or less a day.  Never drink and drive.  Depression- Your emotional health is as important as your physical health.  If you're feeling down or losing interest in things you normally enjoy, please talk to your healthcare provider.  Seat Belts- can save your life; always wear one  Smoke/Carbon Monoxide detectors- These detectors need to be installed on the appropriate level of your home.  Replace batteries at least once a year.  Violence- If   anyone is threatening or hurting you, please tell your healthcare provider.  Living Will/ Health care power of attorney- Discuss with your healthcare provider and family.    IF you received an x-ray today, you will receive an invoice from [facility]. Please contact  Radiology at [phone] with questions or concerns regarding your invoice.   IF you received labwork today, you  will receive an invoice from [facility]. Please contact [facility] at [phone] with questions or concerns regarding your invoice.   Our billing staff will not be able to assist you with questions regarding bills from these companies.  You will be contacted with the lab results as soon as they are available. The fastest way to get your results is to activate your My Chart account. Instructions are located on the last page of this paperwork. If you have not heard from us regarding the results in 2 weeks, please contact this office.     

## 2017-07-04 LAB — CMP14+EGFR
ALK PHOS: 87 IU/L (ref 39–117)
ALT: 22 IU/L (ref 0–32)
AST: 21 IU/L (ref 0–40)
Albumin/Globulin Ratio: 1.6 (ref 1.2–2.2)
Albumin: 4.6 g/dL (ref 3.6–4.8)
BUN/Creatinine Ratio: 17 (ref 12–28)
BUN: 14 mg/dL (ref 8–27)
Bilirubin Total: 0.6 mg/dL (ref 0.0–1.2)
CALCIUM: 9.7 mg/dL (ref 8.7–10.3)
CO2: 24 mmol/L (ref 20–29)
CREATININE: 0.82 mg/dL (ref 0.57–1.00)
Chloride: 103 mmol/L (ref 96–106)
GFR calc Af Amer: 87 mL/min/{1.73_m2} (ref >59)
GFR, EST NON AFRICAN AMERICAN: 75 mL/min/{1.73_m2} (ref >59)
GLOBULIN, TOTAL: 2.9 g/dL (ref 1.5–4.5)
Glucose: 109 mg/dL — ABNORMAL HIGH (ref 65–99)
Potassium: 3.8 mmol/L (ref 3.5–5.2)
SODIUM: 143 mmol/L (ref 134–144)
Total Protein: 7.5 g/dL (ref 6.0–8.5)

## 2017-07-04 LAB — CBC
HEMATOCRIT: 43.5 % (ref 34.0–46.6)
Hemoglobin: 14.4 g/dL (ref 11.1–15.9)
MCH: 27.4 pg (ref 26.6–33.0)
MCHC: 33.1 g/dL (ref 31.5–35.7)
MCV: 83 fL (ref 79–97)
Platelets: 201 10*3/uL (ref 150–379)
RBC: 5.26 x10E6/uL (ref 3.77–5.28)
RDW: 15 % (ref 12.3–15.4)
WBC: 8 10*3/uL (ref 3.4–10.8)

## 2017-07-21 ENCOUNTER — Other Ambulatory Visit: Payer: Self-pay

## 2017-07-21 ENCOUNTER — Ambulatory Visit: Payer: Medicare Other | Admitting: Physician Assistant

## 2017-07-21 VITALS — BP 168/68 | HR 100 | Temp 99.4°F | Resp 16 | Ht 65.0 in | Wt 164.0 lb

## 2017-07-21 DIAGNOSIS — R6889 Other general symptoms and signs: Secondary | ICD-10-CM

## 2017-07-21 DIAGNOSIS — R059 Cough, unspecified: Secondary | ICD-10-CM

## 2017-07-21 DIAGNOSIS — R6883 Chills (without fever): Secondary | ICD-10-CM | POA: Diagnosis not present

## 2017-07-21 DIAGNOSIS — R05 Cough: Secondary | ICD-10-CM

## 2017-07-21 LAB — POCT INFLUENZA A/B
Influenza A, POC: NEGATIVE
Influenza B, POC: NEGATIVE

## 2017-07-21 MED ORDER — OSELTAMIVIR PHOSPHATE 75 MG PO CAPS: 75.0000 mg | ORAL_CAPSULE | Freq: Two times a day (BID) | ORAL | 0 refills | Status: DC

## 2017-07-21 MED ORDER — HYDROCODONE-HOMATROPINE 5-1.5 MG/5ML PO SYRP: 5.0000 mL | ORAL_SOLUTION | Freq: Three times a day (TID) | ORAL | 0 refills | Status: DC | PRN

## 2017-07-21 NOTE — Progress Notes (Signed)
Amanda Swanson  MRN: [id number] DOB: [birth year]  PCP: Patient, No Pcp Per  Subjective:  Pt is a 67 year old female PMH HTN, anxiety, HLD, who presents to clinic for cough and sore throat x 2 days. She endorses sinus pressure, HA, chills, fatigue. Not sleeping well due to symptoms. She is staying well hydrated.  She is allergic to Benadryl.  Denies n/v, chest pain, wheezing, shob.   Review of Systems  Constitutional: Positive for chills and fatigue. Negative for diaphoresis and fever.  HENT: Positive for postnasal drip, rhinorrhea and sore throat. Negative for congestion, sinus pressure and sinus pain.   Respiratory: Positive for cough. Negative for shortness of breath and wheezing.   Psychiatric/Behavioral: Positive for sleep disturbance.    Patient Active Problem List   Diagnosis Date Noted  . Allergic rhinitis 04/14/2015  . Food allergy 04/14/2015  . Oral allergy syndrome 04/14/2015  . Palpitations 04/03/2013  . hyperlipidemia 02/10/2012  . Anxiety 08/26/2011  . HTN (hypertension) 08/26/2011    Current Outpatient Medications on File Prior to Visit  Medication Sig Dispense Refill  . clonazePAM (KLONOPIN) 0.5 MG tablet Take 1 tablet (0.5 mg total) by mouth 2 (two) times daily as needed for anxiety. Takes 1/2 pill prn. 30 tablet 3  . glucose blood test strip To test blood glucose daily. Code R73.03 100 each 12  . Multiple Vitamin (MULTIVITAMIN) tablet Take by mouth daily. Takes 1/2 daily.    . nebivolol (BYSTOLIC) 5 MG tablet Take 1 tablet (5 mg total) by mouth daily. 90 tablet 3   No current facility-administered medications on file prior to visit.     Allergies  Allergen Reactions  . Other Swelling    All Nuts  . Shellfish Allergy     Allergy determined by allergy test - patient does not eat  . Benadryl [Diphenhydramine Hcl (Sleep)] Palpitations  . Singulair [Montelukast Sodium] Palpitations     Objective:  BP (!) 168/68   Pulse 100   Temp 99.4 F (37.4 C)  (Oral)   Resp 16   Ht 5\' 5"  (1.651 m)   Wt 164 lb (74.4 kg)   SpO2 97%   BMI 27.29 kg/m   Physical Exam  Constitutional: She is oriented to person, place, and time and well-developed, well-nourished, and in no distress. No distress.  HENT:  Right Ear: Tympanic membrane normal.  Left Ear: Tympanic membrane normal.  Nose: Mucosal edema present. No rhinorrhea. Right sinus exhibits no maxillary sinus tenderness and no frontal sinus tenderness. Left sinus exhibits no maxillary sinus tenderness and no frontal sinus tenderness.  Mouth/Throat: Oropharynx is clear and moist and mucous membranes are normal.  Cardiovascular: Normal rate, regular rhythm and normal heart sounds.  Pulmonary/Chest: Effort normal and breath sounds normal. No respiratory distress. She has no wheezes. She has no rales.  Neurological: She is alert and oriented to person, place, and time. GCS score is 15.  Skin: Skin is warm and dry.  Psychiatric: Mood, memory, affect and judgment normal.  Vitals reviewed.  Results for orders placed or performed in visit on 07/21/17  POCT Influenza A/B  Result Value Ref Range   Influenza A, POC Negative Negative   Influenza B, POC Negative Negative    Assessment and Plan :  1. Flu-like symptoms - oseltamivir (TAMIFLU) 75 MG capsule; Take 1 capsule (75 mg total) by mouth 2 (two) times daily.  Dispense: 10 capsule; Refill: 0 - POCT flu is negative, suspect false negative. Pt presents with <48  hours of flu-like symptoms. Plan to treat. RTC in 5-7 days if no improvement.  2. Chills - POCT Influenza A/B  3. Cough - HYDROcodone-homatropine (HYCODAN) 5-1.5 MG/5ML syrup; Take 5 mLs by mouth every 8 (eight) hours as needed for cough.  Dispense: 120 mL; Refill: 0   Whitney Annora Guderian, PA-C  Primary [facility] at [facility] 07/21/2017 10:09 AM

## 2017-07-21 NOTE — Patient Instructions (Addendum)
I am treating you today for the flu. Stay well hydrated. Get lost of rest.   Advil or ibuprofen for pain. Do not take Aspirin.  Cepacol Throat lozenges (if you are not at risk for choking) or sprays may be used to soothe your throat. Drink enough water and fluids to keep your urine clear or pale yellow. For sore throat: ? Gargle with 8 oz of salt water ( tsp of salt per 1 qt of water) as often as every 1-2 hours to soothe your throat.  Gargle liquid benadryl.  Use Elderberry syrup.   For sore throat try using a honey-based tea. Use 3 teaspoons of honey with juice squeezed from half lemon. Place shaved pieces of ginger into 1/2-1 cup of water and warm over stove top. Then mix the ingredients and repeat every 4 hours as needed.  Cough Syrup Recipe: Sweet Lemon & Honey Thyme  Ingredients a handful of fresh thyme sprigs   1 pint of water (2 cups)  1/2 cup honey (raw is best, but regular will do)  1/2 lemon chopped Instructions 1. Place the lemon in the pint jar and cover with the honey. The honey will macerate the lemons and draw out liquids which taste so delicious! 2. Meanwhile, toss the thyme leaves into a saucepan and cover them with the water. 3. Bring the water to a gentle simmer and reduce it to half, about a cup of tea. 4. When the tea is reduced and cooled a bit, strain the sprigs & leaves, add it into the pint jar and stir it well. 5. Give it a shake and use a spoonful as needed. 6. Store your homemade cough syrup in the refrigerator for about a month.  What causes a cough? In adults, common causes of a cough include: ?An infection of the airways or lungs (such as the common cold) ?Postnasal drip - Postnasal drip is when mucus from the nose drips down or flows along the back of the throat. Postnasal drip can happen when people have: .A cold .Allergies .A sinus infection - The sinuses are hollow areas in the bones of the face that open into the nose. ?Lung conditions, like  asthma and chronic obstructive pulmonary disease (COPD) - Both of these conditions can make it hard to breathe. COPD is usually caused by smoking. ?Acid reflux - Acid reflux is when the acid that is normally in your stomach backs up into your esophagus (the tube that carries food from your mouth to your stomach). ?A side effect from blood pressure medicines called "ACE inhibitors" ?Smoking cigarettes  Is there anything I can do on my own to get rid of my cough? Yes. To help get rid of your cough, you can: ?Use a humidifier in your bedroom ?Use an over-the-counter cough medicine, or suck on cough drops or hard candy ?Stop smoking, if you smoke ?If you have allergies, avoid the things you are allergic to (like pollen, dust, animals, or mold) If you have acid reflux, your doctor or nurse will tell you which lifestyle changes can help reduce symptoms.    IF you received an x-ray today, you will receive an invoice from [facility]. Please contact [facility] at [phone] with questions or concerns regarding your invoice.   IF you received labwork today, you will receive an invoice from [facility]. Please contact [facility] at [phone] with questions or concerns regarding your invoice.   Our billing staff will not be able to assist you with questions regarding bills  from these companies.  You will be contacted with the lab results as soon as they are available. The fastest way to get your results is to activate your My Chart account. Instructions are located on the last page of this paperwork. If you have not heard from [place] regarding the results in 2 weeks, please contact this office.

## 2017-07-24 ENCOUNTER — Other Ambulatory Visit: Payer: Self-pay | Admitting: Physician Assistant

## 2017-07-24 DIAGNOSIS — I10 Essential (primary) hypertension: Secondary | ICD-10-CM

## 2017-07-24 MED ORDER — NEBIVOLOL HCL 10 MG PO TABS: 5.0000 mg | ORAL_TABLET | Freq: Every day | ORAL | 1 refills | Status: DC

## 2017-07-24 NOTE — Progress Notes (Signed)
Patient reports that the 5mg  was too expensive.  She would like the 10 mg Order placed. Essential hypertension - Plan: nebivolol (BYSTOLIC) 10 MG tablet  Trena PlattStephanie English, PA-C [facility] 1/7/201910:28 AM

## 2017-08-01 ENCOUNTER — Telehealth: Payer: Self-pay

## 2017-08-01 NOTE — Telephone Encounter (Signed)
It is fine to order this whatever way she wants it.

## 2017-08-01 NOTE — Telephone Encounter (Signed)
Reason for CRM: Patient called stating that she needs Judeth CornfieldStephanie English to change her prescription quantity of Nebivolol (BYSTOLIC) 10 MG tablet from 45 pills to 90 pills for a 90 Day period. Patient stated that she cuts the tablets in half during a 90 Day period, but that she must continue to receive the 90 day supply. Patient states that she uses [facility], and that they charge more for a 45 day supply than a 90 Day Supply. Patient has requested for Judeth CornfieldStephanie English to give her a call at [phone].

## 2017-08-02 ENCOUNTER — Other Ambulatory Visit: Payer: Self-pay

## 2017-08-02 DIAGNOSIS — I10 Essential (primary) hypertension: Secondary | ICD-10-CM

## 2017-08-02 MED ORDER — NEBIVOLOL HCL 10 MG PO TABS: 5.0000 mg | ORAL_TABLET | Freq: Every day | ORAL | 1 refills | Status: DC

## 2017-08-02 NOTE — Telephone Encounter (Signed)
Done

## 2017-08-04 ENCOUNTER — Encounter: Payer: Self-pay | Admitting: Physician Assistant

## 2017-08-04 DIAGNOSIS — I10 Essential (primary) hypertension: Secondary | ICD-10-CM

## 2017-08-04 MED ORDER — NEBIVOLOL HCL 10 MG PO TABS: 5.0000 mg | ORAL_TABLET | Freq: Every day | ORAL | 1 refills | Status: DC

## 2017-08-17 ENCOUNTER — Telehealth: Payer: Self-pay | Admitting: Physician Assistant

## 2017-08-17 DIAGNOSIS — I10 Essential (primary) hypertension: Secondary | ICD-10-CM

## 2017-08-17 NOTE — Telephone Encounter (Signed)
Copied from CRM [id number]. Topic: Quick Communication - See Telephone Encounter >> Aug 17, 2017  3:43 PM Floria RavelingStovall, Shana A wrote: CRM for notification. See Telephone encounter for: pt called in and said that she has been taking 10mg  nebivolol (BYSTOLIC) 10 MG tablet [[id number]][[id number]]  1 tab daily and not .05 tabs daily.  She needs new script called in for 1 10mg  tab daily.    Pharmacy - [facility] [address]    08/17/17.

## 2017-08-22 MED ORDER — NEBIVOLOL HCL 10 MG PO TABS: 10.0000 mg | ORAL_TABLET | Freq: Every day | ORAL | 99 refills | Status: AC

## 2017-08-22 NOTE — Telephone Encounter (Signed)
Please Advise

## 2017-08-22 NOTE — Telephone Encounter (Signed)
She said she normally get the 10 mg tablets 90 day supply. Since you wrote the Rx for  .05mg  they ([facility] ) only gave  her 45 tablets and charge her full price $125 for a 90 day supply. So she needs the Rx written for 10 mg tablets 90 day supply, because of the cost. [facility] for [facility]05 tabs. Its more cost effective if the Rx is written for 10 mg tablets one tablet daily 90 day supply, she is in dispute with [facility], so [facility].

## 2017-08-22 NOTE — Telephone Encounter (Signed)
Ordered.  Alert patient

## 2017-08-22 NOTE — Telephone Encounter (Signed)
We discussed this.  She states that she was taking half tablet.  Is this not true? I sent 90 days with 3 refills to pharmacy prior.  I have changed this prior.  Please clarify with patient.  What mg she is taking and frequency.

## 2017-08-25 ENCOUNTER — Encounter: Payer: Medicare Other | Admitting: Obstetrics & Gynecology

## 2017-09-12 ENCOUNTER — Encounter: Payer: Self-pay | Admitting: Obstetrics & Gynecology

## 2017-09-12 ENCOUNTER — Ambulatory Visit (INDEPENDENT_AMBULATORY_CARE_PROVIDER_SITE_OTHER): Payer: Medicare Other | Admitting: Obstetrics & Gynecology

## 2017-09-12 VITALS — BP 140/90 | Ht 65.0 in | Wt 165.0 lb

## 2017-09-12 DIAGNOSIS — Z13820 Encounter for screening for osteoporosis: Secondary | ICD-10-CM | POA: Diagnosis not present

## 2017-09-12 DIAGNOSIS — Z01411 Encounter for gynecological examination (general) (routine) with abnormal findings: Secondary | ICD-10-CM | POA: Diagnosis not present

## 2017-09-12 DIAGNOSIS — Z01419 Encounter for gynecological examination (general) (routine) without abnormal findings: Secondary | ICD-10-CM | POA: Diagnosis not present

## 2017-09-12 DIAGNOSIS — Z780 Asymptomatic menopausal state: Secondary | ICD-10-CM

## 2017-09-12 NOTE — Patient Instructions (Signed)
1. Encounter for gynecological examination with abnormal finding Normal gynecologic exam in menopause.  Last year's Pap showed ASCUS with negative high risk HPV.  Pap reflex repeated today.  Breast exam normal.  Will repeat a screening mammogram in June 2019 at [facility].  Health labs with family physician.  Colonoscopy 5 years ago.  2. Menopause present Well on no hormone replacement therapy.  No postmenopausal bleeding.  3. Screening for osteoporosis Never had a bone density.  Will schedule here now.  Vitamin D supplements, calcium rich nutrition and regular weightbearing physical activity recommended. - DG Bone Density; Future  Amanda Swanson, it was a pleasure seeing you today!  I will inform you of your results as soon as they are available.   Health Maintenance for Postmenopausal Women Menopause is a normal process in which your reproductive ability comes to an end. This process happens gradually over a span of months to years, usually between the ages of 76 and 91. Menopause is complete when you have missed 12 consecutive menstrual periods. It is important to talk with your health care provider about some of the most common conditions that affect postmenopausal women, such as heart disease, cancer, and bone loss (osteoporosis). Adopting a healthy lifestyle and getting preventive care can help to promote your health and wellness. Those actions can also lower your chances of developing some of these common conditions. What should I know about menopause? During menopause, you may experience a number of symptoms, such as:  Moderate-to-severe hot flashes.  Night sweats.  Decrease in sex drive.  Mood swings.  Headaches.  Tiredness.  Irritability.  Memory problems.  Insomnia.  Choosing to treat or not to treat menopausal changes is an individual decision that you make with your health care provider. What should I know about hormone replacement therapy and supplements? Hormone therapy  products are effective for treating symptoms that are associated with menopause, such as hot flashes and night sweats. Hormone replacement carries certain risks, especially as you become older. If you are thinking about using estrogen or estrogen with progestin treatments, discuss the benefits and risks with your health care provider. What should I know about heart disease and stroke? Heart disease, heart attack, and stroke become more likely as you age. This may be due, in part, to the hormonal changes that your body experiences during menopause. These can affect how your body processes dietary fats, triglycerides, and cholesterol. Heart attack and stroke are both medical emergencies. There are many things that you can do to help prevent heart disease and stroke:  Have your blood pressure checked at least every 1-2 years. High blood pressure causes heart disease and increases the risk of stroke.  If you are 25-98 years old, ask your health care provider if you should take aspirin to prevent a heart attack or a stroke.  Do not use any tobacco products, including cigarettes, chewing tobacco, or electronic cigarettes. If you need help quitting, ask your health care provider.  It is important to eat a healthy diet and maintain a healthy weight. ? Be sure to include plenty of vegetables, fruits, low-fat dairy products, and lean protein. ? Avoid eating foods that are high in solid fats, added sugars, or salt (sodium).  Get regular exercise. This is one of the most important things that you can do for your health. ? Try to exercise for at least 150 minutes each week. The type of exercise that you do should increase your heart rate and make you sweat. This is  known as moderate-intensity exercise. ? Try to do strengthening exercises at least twice each week. Do these in addition to the moderate-intensity exercise.  Know your numbers.Ask your health care provider to check your cholesterol and your blood  glucose. Continue to have your blood tested as directed by your health care provider.  What should I know about cancer screening? There are several types of cancer. Take the following steps to reduce your risk and to catch any cancer development as early as possible. Breast Cancer  Practice breast self-awareness. ? This means understanding how your breasts normally appear and feel. ? It also means doing regular breast self-exams. Let your health care provider know about any changes, no matter how small.  If you are 38 or older, have a clinician do a breast exam (clinical breast exam or CBE) every year. Depending on your age, family history, and medical history, it may be recommended that you also have a yearly breast X-ray (mammogram).  If you have a family history of breast cancer, talk with your health care provider about genetic screening.  If you are at high risk for breast cancer, talk with your health care provider about having an MRI and a mammogram every year.  Breast cancer (BRCA) gene test is recommended for women who have family members with BRCA-related cancers. Results of the assessment will determine the need for genetic counseling and BRCA1 and for BRCA2 testing. BRCA-related cancers include these types: ? Breast. This occurs in males or females. ? Ovarian. ? Tubal. This may also be called fallopian tube cancer. ? Cancer of the abdominal or pelvic lining (peritoneal cancer). ? Prostate. ? Pancreatic.  Cervical, Uterine, and Ovarian Cancer Your health care provider may recommend that you be screened regularly for cancer of the pelvic organs. These include your ovaries, uterus, and vagina. This screening involves a pelvic exam, which includes checking for microscopic changes to the surface of your cervix (Pap test).  For women ages 21-65, health care providers may recommend a pelvic exam and a Pap test every three years. For women ages 51-65, they may recommend the Pap test  and pelvic exam, combined with testing for human papilloma virus (HPV), every five years. Some types of HPV increase your risk of cervical cancer. Testing for HPV may also be done on women of any age who have unclear Pap test results.  Other health care providers may not recommend any screening for nonpregnant women who are considered low risk for pelvic cancer and have no symptoms. Ask your health care provider if a screening pelvic exam is right for you.  If you have had past treatment for cervical cancer or a condition that could lead to cancer, you need Pap tests and screening for cancer for at least 20 years after your treatment. If Pap tests have been discontinued for you, your risk factors (such as having a new sexual partner) need to be reassessed to determine if you should start having screenings again. Some women have medical problems that increase the chance of getting cervical cancer. In these cases, your health care provider may recommend that you have screening and Pap tests more often.  If you have a family history of uterine cancer or ovarian cancer, talk with your health care provider about genetic screening.  If you have vaginal bleeding after reaching menopause, tell your health care provider.  There are currently no reliable tests available to screen for ovarian cancer.  Lung Cancer Lung cancer screening is recommended for adults  28-74 years old who are at high risk for lung cancer because of a history of smoking. A yearly low-dose CT scan of the lungs is recommended if you:  Currently smoke.  Have a history of at least 30 pack-years of smoking and you currently smoke or have quit within the past 15 years. A pack-year is smoking an average of one pack of cigarettes per day for one year.  Yearly screening should:  Continue until it has been 15 years since you quit.  Stop if you develop a health problem that would prevent you from having lung cancer treatment.  Colorectal  Cancer  This type of cancer can be detected and can often be prevented.  Routine colorectal cancer screening usually begins at age 72 and continues through age 57.  If you have risk factors for colon cancer, your health care provider may recommend that you be screened at an earlier age.  If you have a family history of colorectal cancer, talk with your health care provider about genetic screening.  Your health care provider may also recommend using home test kits to check for hidden blood in your stool.  A small camera at the end of a tube can be used to examine your colon directly (sigmoidoscopy or colonoscopy). This is done to check for the earliest forms of colorectal cancer.  Direct examination of the colon should be repeated every 5-10 years until age 86. However, if early forms of precancerous polyps or small growths are found or if you have a family history or genetic risk for colorectal cancer, you may need to be screened more often.  Skin Cancer  Check your skin from head to toe regularly.  Monitor any moles. Be sure to tell your health care provider: ? About any new moles or changes in moles, especially if there is a change in a mole's shape or color. ? If you have a mole that is larger than the size of a pencil eraser.  If any of your family members has a history of skin cancer, especially at a young age, talk with your health care provider about genetic screening.  Always use sunscreen. Apply sunscreen liberally and repeatedly throughout the day.  Whenever you are outside, protect yourself by wearing long sleeves, pants, a wide-brimmed hat, and sunglasses.  What should I know about osteoporosis? Osteoporosis is a condition in which bone destruction happens more quickly than new bone creation. After menopause, you may be at an increased risk for osteoporosis. To help prevent osteoporosis or the bone fractures that can happen because of osteoporosis, the following is  recommended:  If you are 75-7 years old, get at least 1,000 mg of calcium and at least 600 mg of vitamin D per day.  If you are older than age 58 but younger than age 78, get at least 1,200 mg of calcium and at least 600 mg of vitamin D per day.  If you are older than age 34, get at least 1,200 mg of calcium and at least 800 mg of vitamin D per day.  Smoking and excessive alcohol intake increase the risk of osteoporosis. Eat foods that are rich in calcium and vitamin D, and do weight-bearing exercises several times each week as directed by your health care provider. What should I know about how menopause affects my mental health? Depression may occur at any age, but it is more common as you become older. Common symptoms of depression include:  Low or sad mood.  Changes  in sleep patterns.  Changes in appetite or eating patterns.  Feeling an overall lack of motivation or enjoyment of activities that you previously enjoyed.  Frequent crying spells.  Talk with your health care provider if you think that you are experiencing depression. What should I know about immunizations? It is important that you get and maintain your immunizations. These include:  Tetanus, diphtheria, and pertussis (Tdap) booster vaccine.  Influenza every year before the flu season begins.  Pneumonia vaccine.  Shingles vaccine.  Your health care provider may also recommend other immunizations. This information is not intended to replace advice given to you by your health care provider. Make sure you discuss any questions you have with your health care provider. Document Released: 08/26/2005 Document Revised: 01/22/2016 Document Reviewed: 04/07/2015 Elsevier Interactive Patient Education  2018 [employer]vier Inc.  

## 2017-09-12 NOTE — Addendum Note (Signed)
Addended by: Berna SpareASTILLO, BLANCA A on: 09/12/2017 09:09 AM   Modules accepted: Orders

## 2017-09-12 NOTE — Progress Notes (Signed)
Amanda Swanson Mar 15, 1951 [id number]   History:    67 y.o. [id number] Married.  Husband present at visit.  RP:  Established patient presenting for annual gyn exam   HPI: Menopause, well on no HRT.  No PMB.  No pelvic pain.  Normal vaginal secretions.  Last Pap test ASCUS, but high risk HPV negative.  Urine normal.  IBS with alternating between loose stools and constipation.  Breasts normal.  Last mammogram negative June 2018 at [facility].  Health labs with family physician, but fasting lipid panel not done.  Patient is changing family physician and will complete lab work.  Glucose was 109.  Low sugar diet recommended.  Planning to see her endocrinologist in the fall.  Will increase physical activity.  Body mass index 27.46.  Many chemical allergies.  Past medical history,surgical history, family history and social history were all reviewed and documented in the EPIC chart.  Gynecologic History No LMP recorded. Patient is postmenopausal. Contraception: post menopausal status and tubal ligation Last Pap: 07/2016. Results were: ASCUS/HPV HR neg Last mammogram: 12/2016. Results were: Negative Bone Density: Neve, will schedule here now Colonoscopy: 5 yrs ago  Obstetric History OB History  Gravida Para Term Preterm AB Living  4 3     1 3   SAB TAB Ectopic Multiple Live Births  1            # Outcome Date GA Lbr Len/2nd Weight Sex Delivery Anes PTL Lv  4 SAB           3 Para           2 Para           1 Para                ROS: A ROS was performed and pertinent positives and negatives are included in the history.  GENERAL: No fevers or chills. HEENT: No change in vision, no earache, sore throat or sinus congestion. NECK: No pain or stiffness. CARDIOVASCULAR: No chest pain or pressure. No palpitations. PULMONARY: No shortness of breath, cough or wheeze. GASTROINTESTINAL: No abdominal pain, nausea, vomiting or diarrhea, melena or bright red blood per rectum. GENITOURINARY: No urinary  frequency, urgency, hesitancy or dysuria. MUSCULOSKELETAL: No joint or muscle pain, no back pain, no recent trauma. DERMATOLOGIC: No rash, no itching, no lesions. ENDOCRINE: No polyuria, polydipsia, no heat or cold intolerance. No recent change in weight. HEMATOLOGICAL: No anemia or easy bruising or bleeding. NEUROLOGIC: No headache, seizures, numbness, tingling or weakness. PSYCHIATRIC: No depression, no loss of interest in normal activity or change in sleep pattern.     Exam:   BP 140/90   Ht 5\' 5"  (1.651 m)   Wt 165 lb (74.8 kg)   BMI 27.46 kg/m   Body mass index is 27.46 kg/m.  General appearance : Well developed well nourished female. No acute distress HEENT: Eyes: no retinal hemorrhage or exudates,  Neck supple, trachea midline, no carotid bruits, no thyroidmegaly Lungs: Clear to auscultation, no rhonchi or wheezes, or rib retractions  Heart: Regular rate and rhythm, no murmurs or gallops Breast:Examined in sitting and supine position were symmetrical in appearance, no palpable masses or tenderness,  no skin retraction, no nipple inversion, no nipple discharge, no skin discoloration, no axillary or supraclavicular lymphadenopathy Abdomen: no palpable masses or tenderness, no rebound or guarding Extremities: no edema or skin discoloration or tenderness  Pelvic: Vulva: Normal  Vagina: No gross lesions or discharge  Cervix: No gross lesions or discharge.  Pap reflex done  Uterus  AV, normal size, shape and consistency, non-tender and mobile  Adnexa  Without masses or tenderness  Anus: Normal   Assessment/Plan:  67 y.o. female for annual exam   1. Encounter for gynecological examination with abnormal finding Normal gynecologic exam in menopause.  Last year's Pap showed ASCUS with negative high risk HPV.  Pap reflex repeated today.  Breast exam normal.  Will [facility].  Health labs with family physician.  Colonoscopy 5 years  ago.  2. Menopause present Well on no hormone replacement therapy.  No postmenopausal bleeding.  3. Screening for osteoporosis Never had a bone density.  Will schedule here now.  Vitamin D supplements, calcium rich nutrition and regular weightbearing physical activity recommended. - DG Bone Density; Future   Amanda DelMarie-Lyne Kyston Gonce MD, 8:23 AM 09/12/2017

## 2017-09-14 LAB — PAP IG W/ RFLX HPV ASCU

## 2017-09-15 ENCOUNTER — Encounter: Payer: Self-pay | Admitting: *Deleted

## 2017-10-03 ENCOUNTER — Other Ambulatory Visit: Payer: Self-pay | Admitting: Obstetrics & Gynecology

## 2017-10-03 ENCOUNTER — Other Ambulatory Visit: Payer: Self-pay | Admitting: Gynecology

## 2017-10-03 DIAGNOSIS — Z780 Asymptomatic menopausal state: Secondary | ICD-10-CM

## 2017-10-16 DIAGNOSIS — M8580 Other specified disorders of bone density and structure, unspecified site: Secondary | ICD-10-CM

## 2017-10-16 HISTORY — DX: Other specified disorders of bone density and structure, unspecified site: M85.80

## 2017-10-25 ENCOUNTER — Encounter: Payer: Self-pay | Admitting: Physician Assistant

## 2017-11-07 ENCOUNTER — Encounter: Payer: Self-pay | Admitting: Gynecology

## 2017-11-07 ENCOUNTER — Other Ambulatory Visit: Payer: Self-pay | Admitting: Gynecology

## 2017-11-07 ENCOUNTER — Ambulatory Visit (INDEPENDENT_AMBULATORY_CARE_PROVIDER_SITE_OTHER): Payer: Medicare Other

## 2017-11-07 DIAGNOSIS — M8588 Other specified disorders of bone density and structure, other site: Secondary | ICD-10-CM

## 2017-11-07 DIAGNOSIS — Z780 Asymptomatic menopausal state: Secondary | ICD-10-CM

## 2018-04-18 IMAGING — US US SOFT TISSUE HEAD/NECK
1 series · 13 of 25 positions shown · non-contrast
Comparison: 05/09/2014

CLINICAL DATA: Bilateral thyroid nodules  subsequent encounter

EXAM:
THYROID ULTRASOUND
TECHNIQUE: Ultrasound examination of the thyroid gland and adjacent soft
tissues was performed.

[Series 1: us soft tissue head/neck · 0.06mm/px · 13 of 43 slices shown]
[im 1/43]
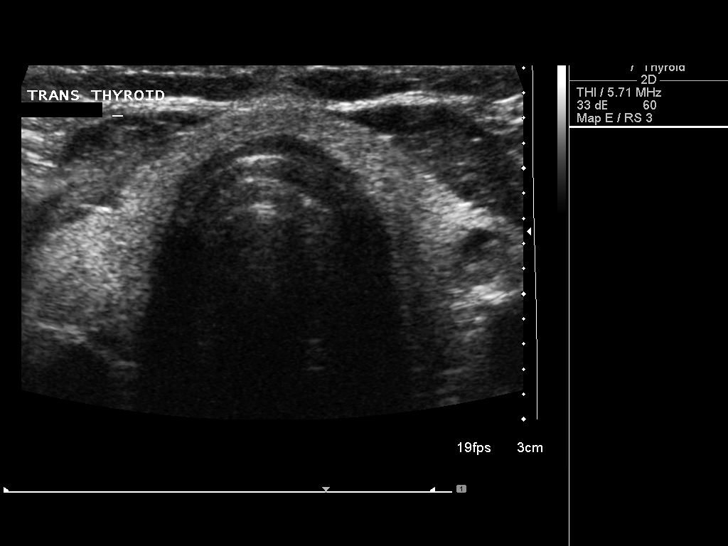
[im 4/43]
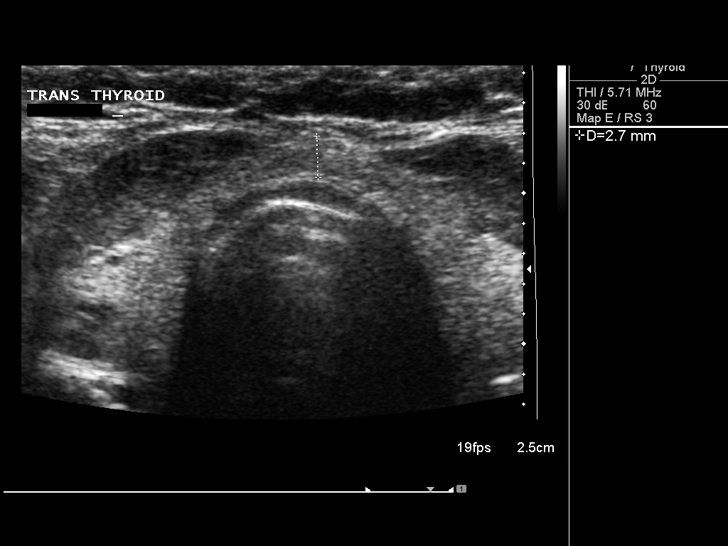
[im 8/43]
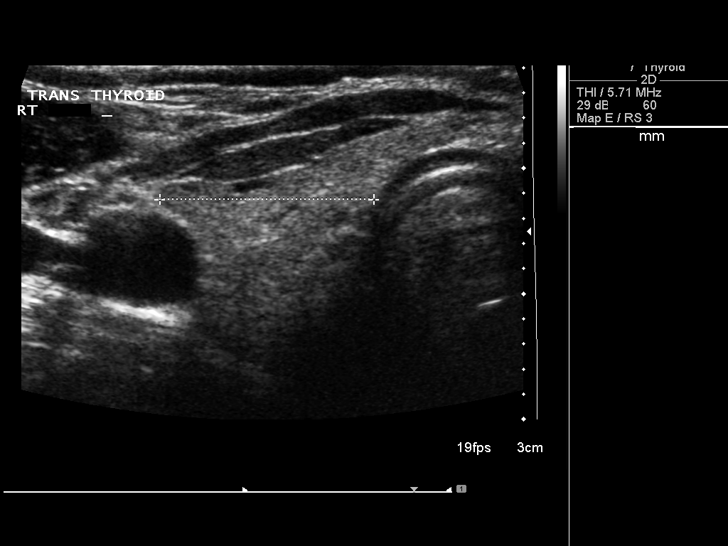
[im 11/43]
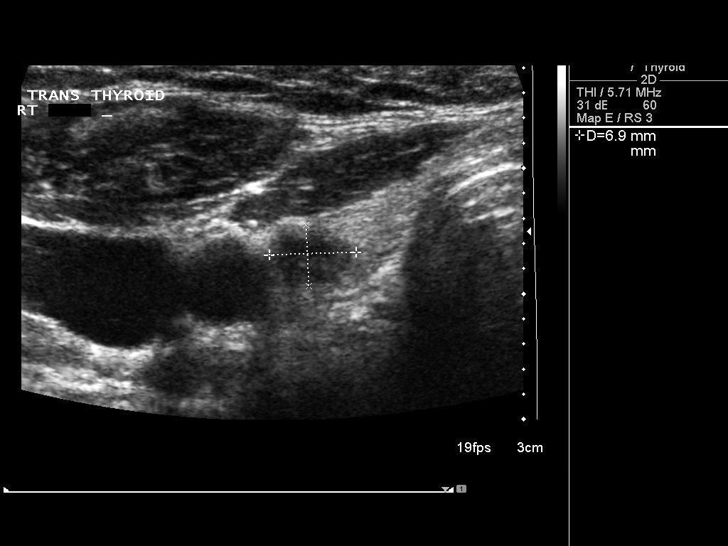
[im 15/43]
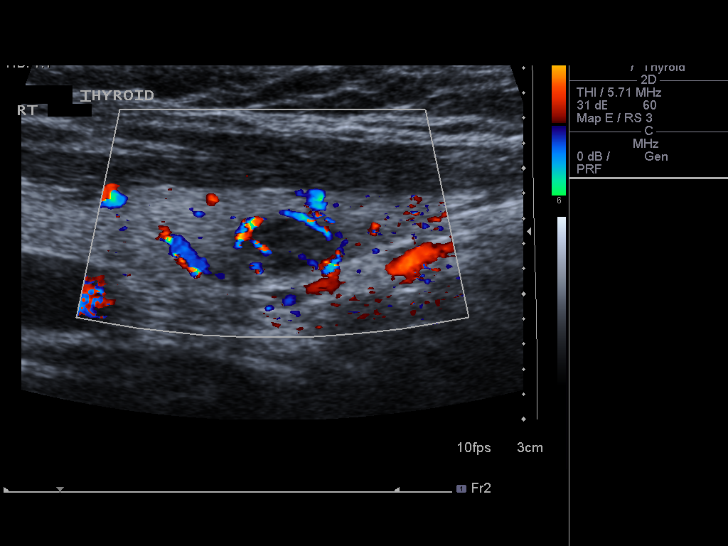
[im 18/43]
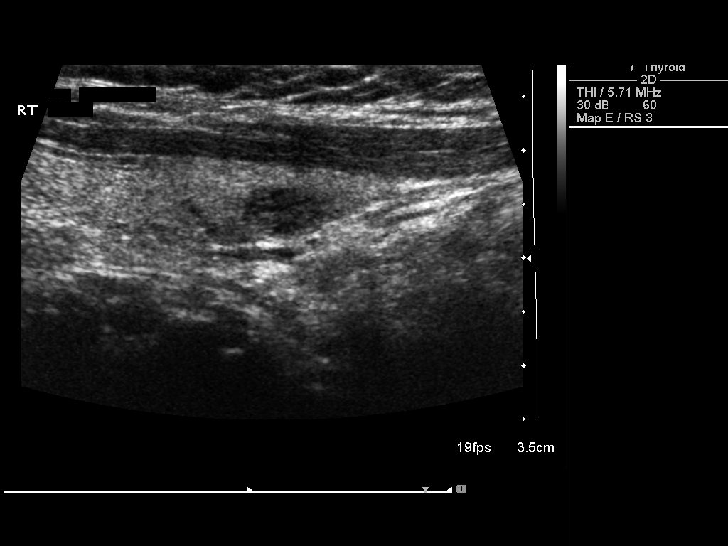
[im 22/43]
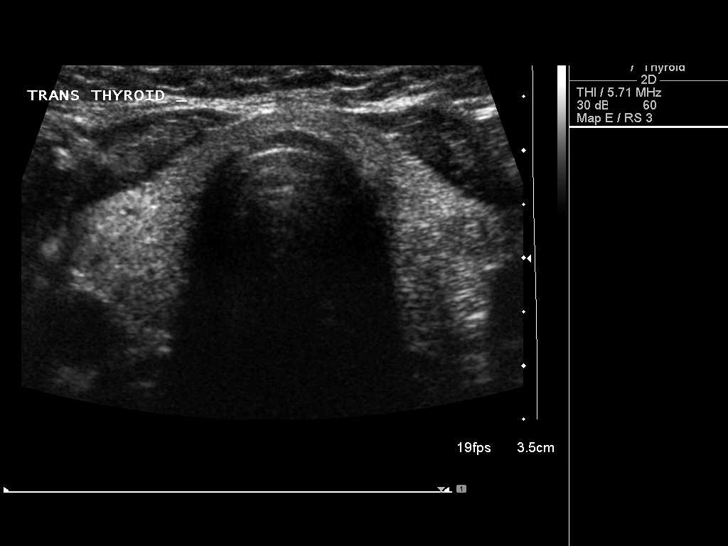
[im 25/43]
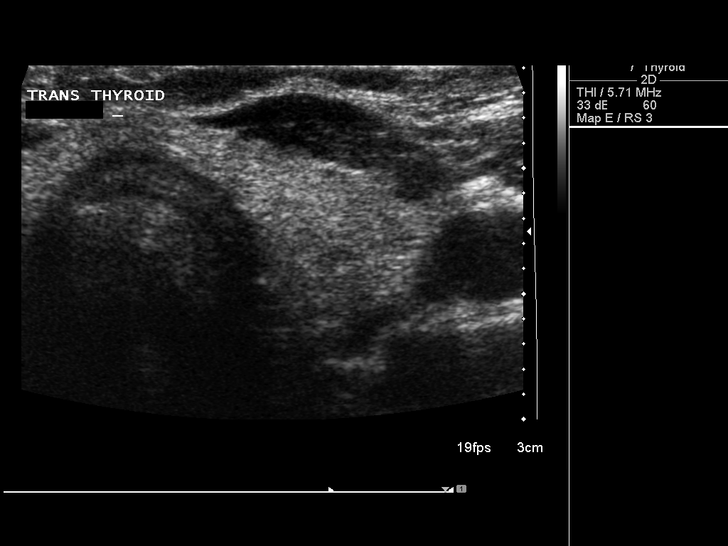
[im 29/43]
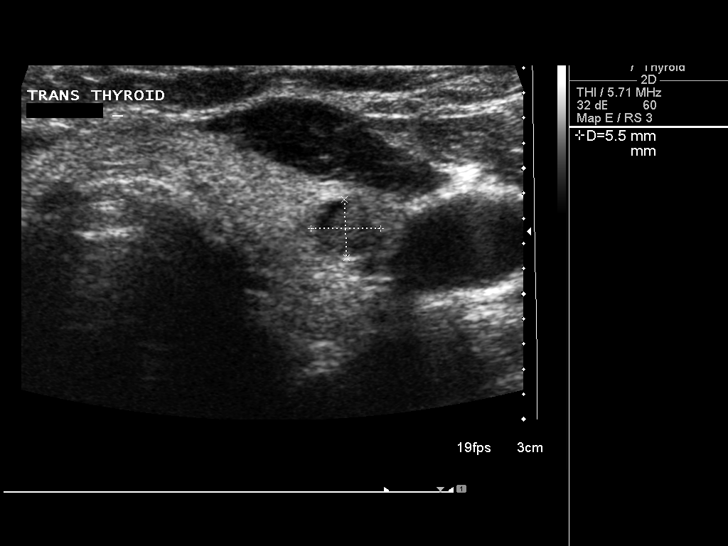
[im 32/43]
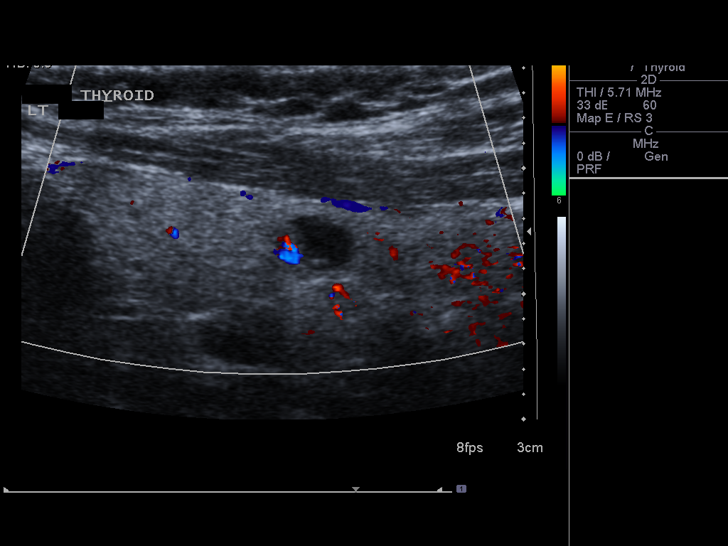
[im 36/43]
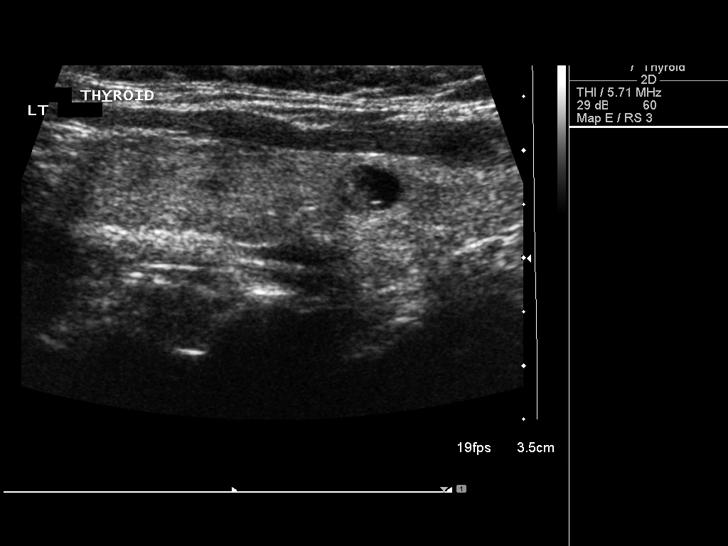
[im 39/43]
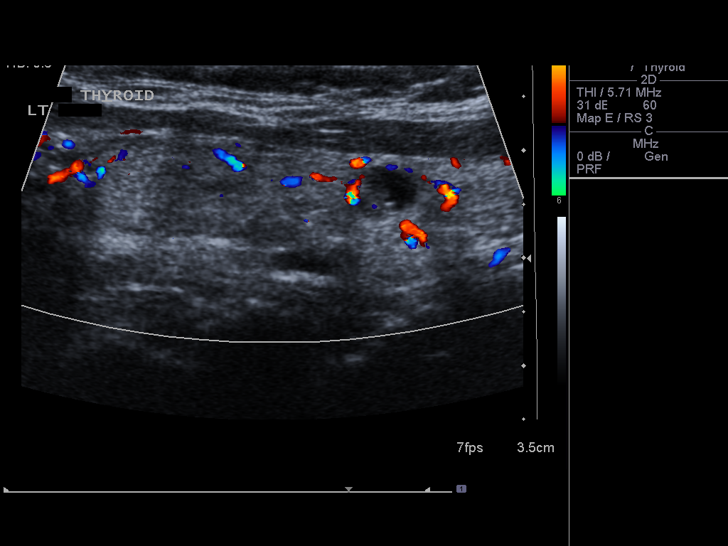
[im 43/43]
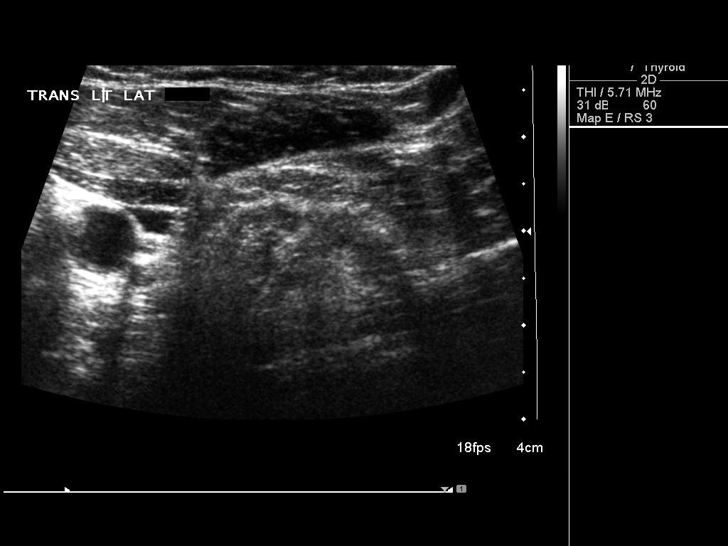

[13 of 25 positions shown; findings below may reference images not displayed]

FINDINGS: Parenchymal Echotexture: Normal

Estimated total number of nodules >/= 1 cm: 0

Number of spongiform nodules >/=  2 cm not described below (TR1): 0

Number of mixed cystic and solid nodules >/= 1.5 cm not described
below (TR2): 0

_________________________________________________________

Isthmus: 3 mm

No discrete nodules are identified within the thyroid isthmus.

_________________________________________________________

Right lobe: 4.4 x 1.2 x 1.7 cm, previously 4.1 x 1.4 x 1.7 cm

Nodule # 1:

Location: Right; Inferior

Size: 9 x 5 x 7 mm, previously 10 x 5 x 6 mm

Composition: solid/almost completely solid (2)

Echogenicity: hypoechoic (2)

Shape: not taller-than-wide (0)

Margins: ill-defined (0)

Echogenic foci: none (0)

ACR TI-RADS total points: 4.

ACR TI-RADS risk category: TR4 (4-6 points).

ACR TI-RADS recommendations:

Given size (<0.9 cm) and appearance, this nodule does NOT meet
TI-RADS criteria for biopsy or dedicated follow-up.

_________________________________________________________

Left lobe: 4.4 x 1.2 x 1.6 cm, previously 4.3 x 1.2 x 1.6 cm

Stable small left lower pole mixed cystic and solid nodule measures
only 6 x 5 x 6 mm, previously 6 x 4 x 6 mm. This also does not
warrant biopsy or any additional follow-up.
IMPRESSION: Stable right inferior 9 mm nodule.

Stable left inferior mixed cystic solid 6 mm nodule.

These nodules do not meet criteria for biopsy or any additional
dedicated follow-up.

The above is in keeping with the ACR TI-RADS recommendations - [HOSPITAL] 9351;[DATE].

## 2018-05-18 ENCOUNTER — Other Ambulatory Visit: Payer: Self-pay | Admitting: Endocrinology

## 2018-05-18 DIAGNOSIS — E049 Nontoxic goiter, unspecified: Secondary | ICD-10-CM

## 2018-06-01 ENCOUNTER — Ambulatory Visit
Admission: RE | Admit: 2018-06-01 | Discharge: 2018-06-01 | Disposition: A | Discharge status: Home / Self Care | Payer: Medicare Other | Source: Ambulatory Visit | Attending: Endocrinology | Admitting: Endocrinology

## 2018-06-01 DIAGNOSIS — E049 Nontoxic goiter, unspecified: Secondary | ICD-10-CM

## 2018-09-21 ENCOUNTER — Encounter: Payer: Self-pay | Admitting: Obstetrics & Gynecology

## 2018-09-21 ENCOUNTER — Ambulatory Visit (INDEPENDENT_AMBULATORY_CARE_PROVIDER_SITE_OTHER): Payer: Medicare Other | Admitting: Obstetrics & Gynecology

## 2018-09-21 VITALS — BP 138/90 | Ht 65.0 in | Wt 161.0 lb

## 2018-09-21 DIAGNOSIS — M8589 Other specified disorders of bone density and structure, multiple sites: Secondary | ICD-10-CM | POA: Diagnosis not present

## 2018-09-21 DIAGNOSIS — Z780 Asymptomatic menopausal state: Secondary | ICD-10-CM

## 2018-09-21 DIAGNOSIS — Z01419 Encounter for gynecological examination (general) (routine) without abnormal findings: Secondary | ICD-10-CM

## 2018-09-21 NOTE — Progress Notes (Signed)
Amanda Swanson 1951/07/15 [id number]   History:    68 y.o. [id number]   RP:  Established patient presenting for annual gyn exam   HPI:  Menopause, well on no HRT.  No PMB.  No pelvic pain.  Normal vaginal secretions.  Urine normal.  IBS with alternating between loose stools and constipation.  Breasts normal.  Health labs with family physician, but fasting lipid panel not done.  Patient is changing family physician and will complete lab work.  Glucose was 109.  Low sugar diet recommended.  Planning to see her endocrinologist in the fall.  Will increase physical activity.  Body mass index 27.46.  Many chemical allergies.  Past medical history,surgical history, family history and social history were all reviewed and documented in the EPIC chart.  Gynecologic History No LMP recorded. Patient is postmenopausal. Contraception: post menopausal status Last Pap: 08/2017. Results were: Negative Last mammogram: 2019. Results were: normal, will obtain report from [facility] Bone Density: 10/2017 Osteopenia Colonoscopy: 9 years ago  Obstetric History OB History  Gravida Para Term Preterm AB Living  4 3     1 3   SAB TAB Ectopic Multiple Live Births  1            # Outcome Date GA Lbr Len/2nd Weight Sex Delivery Anes PTL Lv  4 SAB           3 Para           2 Para           1 Para              ROS: A ROS was performed and pertinent positives and negatives are included in the history.  GENERAL: No fevers or chills. HEENT: No change in vision, no earache, sore throat or sinus congestion. NECK: No pain or stiffness. CARDIOVASCULAR: No chest pain or pressure. No palpitations. PULMONARY: No shortness of breath, cough or wheeze. GASTROINTESTINAL: No abdominal pain, nausea, vomiting or diarrhea, melena or bright red blood per rectum. GENITOURINARY: No urinary frequency, urgency, hesitancy or dysuria. MUSCULOSKELETAL: No joint or muscle pain, no back pain, no recent trauma. DERMATOLOGIC: No rash, no  itching, no lesions. ENDOCRINE: No polyuria, polydipsia, no heat or cold intolerance. No recent change in weight. HEMATOLOGICAL: No anemia or easy bruising or bleeding. NEUROLOGIC: No headache, seizures, numbness, tingling or weakness. PSYCHIATRIC: No depression, no loss of interest in normal activity or change in sleep pattern.     Exam:   BP 138/90   Ht 5\' 5"  (1.651 m)   Wt 161 lb (73 kg)   BMI 26.79 kg/m   Body mass index is 26.79 kg/m.  General appearance : Well developed well nourished female. No acute distress HEENT: Eyes: no retinal hemorrhage or exudates,  Neck supple, trachea midline, no carotid bruits, no thyroidmegaly Lungs: Clear to auscultation, no rhonchi or wheezes, or rib retractions  Heart: Regular rate and rhythm, no murmurs or gallops Breast:Examined in sitting and supine position were symmetrical in appearance, no palpable masses or tenderness,  no skin retraction, no nipple inversion, no nipple discharge, no skin discoloration, no axillary or supraclavicular lymphadenopathy Abdomen: no palpable masses or tenderness, no rebound or guarding Extremities: no edema or skin discoloration or tenderness  Pelvic: Vulva: Normal             Vagina: No gross lesions or discharge  Cervix: No gross lesions or discharge  Uterus  AV, normal size, shape and consistency, non-tender and mobile  Adnexa  Without masses or tenderness  Anus: Normal   Assessment/Plan:  68 y.o. female for annual exam   1. Well female exam with routine gynecological exam Normal gynecologic exam.  Pap test February 2019 was negative, no indication to repeat this year.  Breast exam normal.  Will obtain screening mammogram report from [facility].  Repeat colonoscopy next year.  Health labs with family physician.  2. Postmenopausal Well on no hormone replacement therapy.  No postmenopausal bleeding.  3. Osteopenia of multiple sites Last bone density showing osteopenia in April 2019.  Recommend vitamin D  supplements, calcium intake of 1.5 g/day and regular weightbearing physical activities.  Genia Del MD, 3:03 PM 09/21/2018

## 2018-09-24 ENCOUNTER — Encounter: Payer: Self-pay | Admitting: Obstetrics & Gynecology

## 2018-09-24 NOTE — Patient Instructions (Addendum)
1. Well female exam with routine gynecological exam Normal gynecologic exam.  Pap test February 2019 was negative, no indication to repeat this year.  Breast exam normal.  Will obtain screening mammogram report from [facility].  Repeat colonoscopy next year.  Health labs with family physician.  2. Postmenopausal Well on no hormone replacement therapy.  No postmenopausal bleeding.  3. Osteopenia of multiple sites Last bone density showing osteopenia in April 2019.  Recommend vitamin D supplements, calcium intake of 1.5 g/day and regular weightbearing physical activities.  Amanda Swanson, it was a pleasure seeing you today!

## 2019-04-24 ENCOUNTER — Encounter: Payer: Self-pay | Admitting: Gynecology

## 2019-10-03 ENCOUNTER — Other Ambulatory Visit: Payer: Self-pay

## 2019-10-04 ENCOUNTER — Ambulatory Visit (INDEPENDENT_AMBULATORY_CARE_PROVIDER_SITE_OTHER): Payer: Medicare Other | Admitting: Obstetrics & Gynecology

## 2019-10-04 ENCOUNTER — Encounter: Payer: Self-pay | Admitting: Obstetrics & Gynecology

## 2019-10-04 VITALS — BP 146/94 | Ht 65.0 in | Wt 161.0 lb

## 2019-10-04 DIAGNOSIS — M8589 Other specified disorders of bone density and structure, multiple sites: Secondary | ICD-10-CM

## 2019-10-04 DIAGNOSIS — Z780 Asymptomatic menopausal state: Secondary | ICD-10-CM

## 2019-10-04 DIAGNOSIS — Z01419 Encounter for gynecological examination (general) (routine) without abnormal findings: Secondary | ICD-10-CM

## 2019-10-04 NOTE — Patient Instructions (Signed)
1. Encounter for routine gynecological examination with Papanicolaou smear of cervix Normal gynecologic exam.  Pap reflex done.  Breast exam normal.  Screening mammogram normal per patient in June 2020, will obtain report from [place].  Health labs with Dr. Evlyn Kanner.  2. Postmenopausal Well on no hormone replacement therapy.  No postmenopausal bleeding.    3. Osteopenia of multiple sites Osteopenia on bone density April 2019 with lowest T score at -2.0 at the lumbar spine.  We will repeat a bone density here at the end of April or shortly after.  Continue with vitamin D supplements.  Calcium intake of 1.2 g daily and regular weightbearing physical activities. - DG Bone Density; Future  Other orders - cholecalciferol (VITAMIN D3) 25 MCG (1000 UNIT) tablet; Take 1,000 Units by mouth daily. - magnesium 30 MG tablet; Take 30 mg by mouth 2 (two) times daily. - co-enzyme Q-10 30 MG capsule; Take 30 mg by mouth 3 (three) times daily.  Willa, it was a pleasure seeing you today!  I will inform you of your results as soon as they are available.

## 2019-10-04 NOTE — Addendum Note (Signed)
Addended by: Berna Spare A on: 10/04/2019 03:17 PM   Modules accepted: Orders

## 2019-10-04 NOTE — Progress Notes (Signed)
Amanda Swanson [birth year] [id number]   History:    69 y.o. [id number] Married  RP:  Established patient presenting for annual gyn exam   HPI: Menopause, well on no HRT. No PMB.No pelvic pain. Normal vaginal secretions.  No pain with IC. Urine normal. IBS with alternating between loose stools and constipation. Breasts normal. Body mass index 26.79.  Good fitness.  Followed by Dr Forde Dandy.  Last HBA1C was 5.1.   Past medical history,surgical history, family history and social history were all reviewed and documented in the EPIC chart.  Gynecologic History No LMP recorded. Patient is postmenopausal.  Obstetric History OB History  Gravida Para Term Preterm AB Living  4 3     1 3   SAB TAB Ectopic Multiple Live Births  1            # Outcome Date GA Lbr Len/2nd Weight Sex Delivery Anes PTL Lv  4 SAB           3 Para           2 Para           1 Para              ROS: A ROS was performed and pertinent positives and negatives are included in the history.  GENERAL: No fevers or chills. HEENT: No change in vision, no earache, sore throat or sinus congestion. NECK: No pain or stiffness. CARDIOVASCULAR: No chest pain or pressure. No palpitations. PULMONARY: No shortness of breath, cough or wheeze. GASTROINTESTINAL: No abdominal pain, nausea, vomiting or diarrhea, melena or bright red blood per rectum. GENITOURINARY: No urinary frequency, urgency, hesitancy or dysuria. MUSCULOSKELETAL: No joint or muscle pain, no back pain, no recent trauma. DERMATOLOGIC: No rash, no itching, no lesions. ENDOCRINE: No polyuria, polydipsia, no heat or cold intolerance. No recent change in weight. HEMATOLOGICAL: No anemia or easy bruising or bleeding. NEUROLOGIC: No headache, seizures, numbness, tingling or weakness. PSYCHIATRIC: No depression, no loss of interest in normal activity or change in sleep pattern.     Exam:   BP (!) 146/94   Ht 5\' 5"  (1.651 m)   Wt 161 lb (73 kg)   BMI 26.79 kg/m    Body mass index is 26.79 kg/m.  General appearance : Well developed well nourished female. No acute distress HEENT: Eyes: no retinal hemorrhage or exudates,  Neck supple, trachea midline, no carotid bruits, no thyroidmegaly Lungs: Clear to auscultation, no rhonchi or wheezes, or rib retractions  Heart: Regular rate and rhythm, no murmurs or gallops Breast:Examined in sitting and supine position were symmetrical in appearance, no palpable masses or tenderness,  no skin retraction, no nipple inversion, no nipple discharge, no skin discoloration, no axillary or supraclavicular lymphadenopathy Abdomen: no palpable masses or tenderness, no rebound or guarding Extremities: no edema or skin discoloration or tenderness  Pelvic: Vulva: Normal             Vagina: No gross lesions or discharge  Cervix: No gross lesions or discharge.  Pap reflex done.  Uterus  AV, normal size, shape and consistency, non-tender and mobile  Adnexa  Without masses or tenderness  Anus: Normal   Assessment/Plan:  69 y.o. female for annual exam   1. Encounter for routine gynecological examination with Papanicolaou smear of cervix Normal gynecologic exam.  Pap reflex done.  Breast exam normal.  Screening mammogram normal per patient in June 2020, will obtain report from [place].  Health labs with Dr. Forde Dandy.  2. Postmenopausal Well on no hormone replacement therapy.  No postmenopausal bleeding.    3. Osteopenia of multiple sites Osteopenia on bone density April 2019 with lowest T score at -2.0 at the lumbar spine.  We will repeat a bone density here at the end of April or shortly after.  Continue with vitamin D supplements.  Calcium intake of 1.2 g daily and regular weightbearing physical activities. - DG Bone Density; Future  Other orders - cholecalciferol (VITAMIN D3) 25 MCG (1000 UNIT) tablet; Take 1,000 Units by mouth daily. - magnesium 30 MG tablet; Take 30 mg by mouth 2 (two) times daily. - co-enzyme Q-10 30 MG  capsule; Take 30 mg by mouth 3 (three) times daily.  Genia Del MD, 2:29 PM 10/04/2019

## 2019-10-10 LAB — PAP IG W/ RFLX HPV ASCU

## 2019-10-10 LAB — HUMAN PAPILLOMAVIRUS, HIGH RISK: HPV DNA High Risk: NOT DETECTED

## 2019-11-20 ENCOUNTER — Other Ambulatory Visit: Payer: Self-pay

## 2019-11-21 ENCOUNTER — Ambulatory Visit (INDEPENDENT_AMBULATORY_CARE_PROVIDER_SITE_OTHER): Payer: Medicare Other

## 2019-11-21 ENCOUNTER — Other Ambulatory Visit: Payer: Self-pay | Admitting: Obstetrics & Gynecology

## 2019-11-21 DIAGNOSIS — M8589 Other specified disorders of bone density and structure, multiple sites: Secondary | ICD-10-CM

## 2019-11-21 DIAGNOSIS — Z780 Asymptomatic menopausal state: Secondary | ICD-10-CM

## 2020-05-16 ENCOUNTER — Other Ambulatory Visit: Payer: Self-pay

## 2020-05-16 ENCOUNTER — Ambulatory Visit: Payer: Medicare Other | Attending: Internal Medicine

## 2020-05-16 DIAGNOSIS — Z23 Encounter for immunization: Secondary | ICD-10-CM

## 2020-05-16 NOTE — Progress Notes (Signed)
° °  Covid-19 Vaccination [facility]  Name:  Amanda Swanson    MRN: [id number] DOB: [birth year]  05/16/2020  Ms. Amanda Swanson was observed post Covid-19 immunization for 15 minutes without incident. She was provided with Vaccine Information Sheet and instruction to access the V-Safe system.   Ms. Amanda Swanson was instructed to call [phone] with any severe reactions post vaccine:  Difficulty breathing   Swelling of face and throat   A fast heartbeat   A bad rash all over body   Dizziness and weakness

## 2020-08-24 ENCOUNTER — Encounter: Payer: Self-pay | Admitting: Allergy

## 2020-08-24 ENCOUNTER — Ambulatory Visit: Payer: Medicare Other | Admitting: Allergy

## 2020-08-24 ENCOUNTER — Other Ambulatory Visit: Payer: Self-pay | Admitting: Allergy

## 2020-08-24 ENCOUNTER — Other Ambulatory Visit: Payer: Self-pay

## 2020-08-24 VITALS — BP 160/82 | HR 84 | Temp 98.1°F | Resp 17 | Ht 65.75 in | Wt 160.2 lb

## 2020-08-24 DIAGNOSIS — L299 Pruritus, unspecified: Secondary | ICD-10-CM

## 2020-08-24 DIAGNOSIS — J310 Chronic rhinitis: Secondary | ICD-10-CM

## 2020-08-24 DIAGNOSIS — R0602 Shortness of breath: Secondary | ICD-10-CM | POA: Diagnosis not present

## 2020-08-24 DIAGNOSIS — T781XXD Other adverse food reactions, not elsewhere classified, subsequent encounter: Secondary | ICD-10-CM | POA: Diagnosis not present

## 2020-08-24 MED ORDER — LEVALBUTEROL TARTRATE 45 MCG/ACT IN AERO: 2.0000 | INHALATION_SPRAY | Freq: Four times a day (QID) | RESPIRATORY_TRACT | 1 refills | Status: DC | PRN

## 2020-08-24 MED ORDER — MOMETASONE FUROATE 50 MCG/ACT NA SUSP: 1.0000 | Freq: Every day | NASAL | 5 refills | Status: DC

## 2020-08-24 MED ORDER — EPINEPHRINE 0.3 MG/0.3ML IJ SOAJ: 0.3000 mg | INTRAMUSCULAR | 2 refills | Status: AC | PRN

## 2020-08-24 NOTE — Progress Notes (Signed)
New Patient Note  RE: Amanda Swanson MRN: [id number] DOB: 02/06/1951 Date of Office Visit: 08/24/2020  Referring provider: Adrian Prince, MD Primary care provider: Adrian Prince, MD  Chief Complaint: Allergic Rhinitis   History of Present Illness: I had the pleasure of seeing Amanda Swanson for initial evaluation at the [facility] on 08/25/2020. She is a 70 y.o. female, who is referred here by Adrian Prince, MD for the evaluation of allergic rhinitis.   Last seen in the office on 04/16/2015 by Dr. Willa Rough for mixed allergic and non-allergic rhinitis, PND, cough, food allergy.  Returning to allergy due to worsening symptoms and wondering if she is developing new allergies.   Rhinitis: She reports symptoms of shortness of breath, itchy eyes, ear pain, minimal rhinitis. Symptoms have been going on for many years but worse the past 9 years. The symptoms are present all year around with worsening in spring. Other triggers include exposure to perfumes, strong scents. Anosmia: no. Headache: sometimes. She has used aller-7 with fair improvement in symptoms. Antihistamines caused palpitations so she can't take them.. She tried Flonase in the past but does not remember if it helped or not. Sinus infections: no. Previous work up includes: in the past showed multiple positives per patient report. No previous allergy injections.  Not interested in skin prick testing today.  Previous ENT evaluation: no. Previous sinus imaging: no. History of nasal polyps: no. Last eye exam: last year. History of reflux: not usually.  Breathing:  She reports symptoms of shortness of breath, coughing, nocturnal awakenings for 1 years. Current medications include none. She reports not using aerochamber with inhalers. She tried the following inhalers: none. Main triggers are strong scents, smoke exposure. In the last month, frequency of symptoms: depends. Frequency of nocturnal symptoms: depends.  Frequency of SABA use: 0x/week. Interference with physical activity: 0. Sleep is disturbed. In the last 12 months, emergency room visits/urgent care visits/doctor office visits or hospitalizations due to respiratory issues: no. In the last 12 months, oral steroids courses: no. Lifetime history of hospitalization for respiratory issues: no. Prior intubations: no. History of pneumonia: no. Smoking exposure: no. Up to date with flu vaccine: no. Up to date with pneumonia vaccine: yes. Up to date with COVID-19 vaccine: yes.   Food allergy:  Bananas caused diarrhea recently. Apples caused lip swelling, mouth tingling. Peanuts, tree nuts, coconut - lip swelling, mouth tingling, throat tightness. Shellfish - positive on skin testing and not sure of exact clinical history. Milk - cramping, diarrhea  Dietary History: patient has been eating other foods including eggs, sesame, fish, soy, wheat, meats, fruits and vegetables.  Patent has IBS as well.   Assessment and Plan: Amanda Swanson is a 69 y.o. female with: Chronic rhinitis Perennial symptoms of shortness of breath, itchy eyes, ear pain with minimal rhinitis which has been worsening the past 9 years and during the spring. Other triggers include exposure to strong scents. Antihistamines caused palpitations in the past. Not sure if Flonase was helpful. Not interested in allergy skin testing today or immunotherapy. Takes a naturopathic medication called aller-7 with some benefit. Denies reflux. No prior ENT evaluation.  Patient prefers bloodwork over skin testing - ordered environmental allergy panel and will make additional recommendations based on results.   Try Nasacort, Rhinocort, Nasonex 1-2 sprays per nostril daily for nasal symptoms.   I sent in prescription for Nasonex to see if it's covered.  If not covered, these are all available over the counter.  Avoid Flonase due to flowery scent.   Nasal saline spray (i.e., Simply Saline) or nasal saline  lavage (i.e., NeilMed) is recommended as needed and prior to medicated nasal sprays.  Consider ENT evaluation if no improvement with above regimen.  Shortness of breath Shortness of breath and coughing episodes for 1 year. No prior inhaler use or asthma diagnosis.   Today's spirometry was not of ideal effort and no improvement noted after bronchodilator treatment. Clinically feeling unchanged.  Will send in levoalbuterol due to her issues with palpitations and anxiety history.   May use levoalbuterol rescue inhaler 2 puffs every 4 to 6 hours as needed for shortness of breath, chest tightness, coughing, and wheezing. Monitor frequency of use.   Adverse reaction to food, subsequent encounter Patient avoiding several foods and has IBS as well. Most recently bananas caused diarrhea. Apples, peanuts, tree nuts, coconut - perioral symptoms. Shellfish - positive on skin testing apparently but not sure of clinical reactions. Dairy - GI symptoms.   Continue to avoid peanuts, tree nuts, coconut, shellfish, apples, bananas and milk. Get bloodwork.  I have prescribed epinephrine injectable and demonstrated proper use. For mild symptoms you can take over the counter antihistamines such as Benadryl and monitor symptoms closely. If symptoms worsen or if you have severe symptoms including breathing issues, throat closure, significant swelling, whole body hives, severe diarrhea and vomiting, lightheadedness then inject epinephrine and seek immediate medical care afterwards.  Food action plan given.  Pruritus Pruritus with no known triggers. Had recent bloodwork from PCP - in process of requesting records.  See below for proper skin care.  Return in about 2 months (around 10/22/2020).  Meds ordered this encounter  Medications  . mometasone (NASONEX) 50 MCG/ACT nasal spray    Sig: Place 1-2 sprays into the nose daily.    Dispense:  1 each    Refill:  5  . DISCONTD: levalbuterol (XOPENEX HFA) 45  MCG/ACT inhaler    Sig: Inhale 2 puffs into the lungs every 6 (six) hours as needed for wheezing or shortness of breath.    Dispense:  1 each    Refill:  1  . EPINEPHrine 0.3 mg/0.3 mL IJ SOAJ injection    Sig: Inject 0.3 mg into the muscle as needed for anaphylaxis.    Dispense:  1 each    Refill:  2    Lab Orders     Allergens w/Total IgE Area 2     IgE Milk w/ Component Reflex     IgE Nut Prof. w/Component Rflx     Allergen Profile, Shellfish     Allergen, Banana f92     Apple IgE  Other allergy screening: Medication allergy: yes Hymenoptera allergy: no Urticaria: yes after scratching area.  Eczema:no History of recurrent infections suggestive of immunodeficency: no  Diagnostics: Spirometry:  Tracings reviewed. Her effort: It was hard to get consistent efforts and there is a question as to whether this reflects a maximal maneuver. FVC: 1.93L FEV1: 1.58L, 80% predicted FEV1/FVC ratio: 82% Interpretation: Spirometry consistent with possible restrictive disease with no improvement in FEV1 post bronchodilator treatment. Clinically feeling unchanged.  Patient had difficulty performing spirometry.  Please see scanned spirometry results for details.  Skin Testing: None.  Past Medical History: Patient Active Problem List   Diagnosis Date Noted  . Chronic rhinitis 08/25/2020  . Pruritus 08/25/2020  . Shortness of breath 08/25/2020  . Adverse reaction to food, subsequent encounter 08/24/2020  . Allergic rhinitis 04/14/2015  . Food allergy  04/14/2015  . Oral allergy syndrome 04/14/2015  . Palpitations 04/03/2013  . hyperlipidemia 02/10/2012  . Anxiety 08/26/2011  . HTN (hypertension) 08/26/2011   Past Medical History:  Diagnosis Date  . Allergy   . Anxiety   . Cataract   . Heart murmur   . Hypertension   . IBS (irritable bowel syndrome)   . Osteopenia 10/2017   T score -2.0 FRAX 3.6% / 0.3%  . Urticaria    Past Surgical History: Past Surgical History:   Procedure Laterality Date  . BREAST SURGERY     Medication List:  Current Outpatient Medications  Medication Sig Dispense Refill  . cholecalciferol (VITAMIN D3) 25 MCG (1000 UNIT) tablet Take 1,000 Units by mouth daily.    Marland Kitchen co-enzyme Q-10 30 MG capsule Take 30 mg by mouth 3 (three) times daily.    Marland Kitchen EPINEPHrine 0.3 mg/0.3 mL IJ SOAJ injection Inject 0.3 mg into the muscle as needed for anaphylaxis. 1 each 2  . magnesium 30 MG tablet Take 30 mg by mouth 2 (two) times daily.    . mometasone (NASONEX) 50 MCG/ACT nasal spray Place 1-2 sprays into the nose daily. 1 each 5  . Multiple Vitamin (MULTIVITAMIN) tablet Take by mouth daily. Takes 1/2 daily.    . nebivolol (BYSTOLIC) 10 MG tablet Take 1 tablet (10 mg total) by mouth daily. 90 tablet 1  . albuterol (PROAIR HFA) 108 (90 Base) MCG/ACT inhaler Inhale 2 puffs into the lungs every 4 (four) hours as needed for wheezing or shortness of breath. 1 each 1   No current facility-administered medications for this visit.   Allergies: Allergies  Allergen Reactions  . Other Swelling    All Nuts  . Shellfish Allergy     Allergy determined by allergy test - patient does not eat  . Benadryl [Diphenhydramine Hcl (Sleep)] Palpitations  . Singulair [Montelukast Sodium] Palpitations   Social History: Social History   Socioeconomic History  . Marital status: Married    Spouse name: Not on file  . Number of children: Not on file  . Years of education: Not on file  . Highest education level: Not on file  Occupational History  . Not on file  Tobacco Use  . Smoking status: Never Smoker  . Smokeless tobacco: Never Used  Vaping Use  . Vaping Use: Never used  Substance and Sexual Activity  . Alcohol use: No    Alcohol/week: 0.0 standard drinks  . Drug use: No  . Sexual activity: Yes  Other Topics Concern  . Not on file  Social History Narrative   4 pregnancies   3 live births   1 miscarriage   Social Determinants of Health   Financial  Resource Strain: Not on file  Food Insecurity: Not on file  Transportation Needs: Not on file  Physical Activity: Not on file  Stress: Not on file  Social Connections: Not on file   Lives in a 69 year old house. Smoking: denies Occupation: retired  Landscape architect HistorySurveyor, minerals in the house: no Engineer, civil (consulting) in the family room: no Carpet in the bedroom: no Heating: gas Cooling: central Pet: no  Family History: Family History  Problem Relation Age of Onset  . Heart disease Father        MI  . Cancer Sister        breast  . Diabetes Sister        also hypertension   . Diabetes Brother   . Hypertension Brother  x3  . Cancer Mother   . Diabetes Brother    Problem                               Relation Asthma                                   No  Eczema                                No  Food allergy                          No  Allergic rhino conjunctivitis     No   Review of Systems  Constitutional: Negative for appetite change, chills, fever and unexpected weight change.  HENT: Positive for congestion. Negative for rhinorrhea.   Eyes: Negative for itching.  Respiratory: Negative for cough, chest tightness, shortness of breath and wheezing.   Cardiovascular: Negative for chest pain.  Gastrointestinal: Positive for constipation. Negative for abdominal pain.  Genitourinary: Negative for difficulty urinating.  Skin: Negative for rash.  Neurological: Negative for headaches.   Objective: BP (!) 160/82 (BP Location: Left Arm, Patient Position: Sitting, Cuff Size: Normal)   Pulse 84   Temp 98.1 F (36.7 C) (Temporal)   Resp 17   Ht 5' 5.75" (1.67 m)   Wt 160 lb 3.2 oz (72.7 kg)   SpO2 98%   BMI 26.06 kg/m  Body mass index is 26.06 kg/m. Physical Exam Vitals and nursing note reviewed.  Constitutional:      Appearance: Normal appearance. She is well-developed.  HENT:     Head: Normocephalic and atraumatic.     Right Ear: Tympanic membrane and  external ear normal.     Left Ear: Tympanic membrane and external ear normal.     Nose: Congestion (on right side) present.     Mouth/Throat:     Mouth: Mucous membranes are moist.     Pharynx: Oropharynx is clear.  Eyes:     Conjunctiva/sclera: Conjunctivae normal.  Cardiovascular:     Rate and Rhythm: Normal rate and regular rhythm.     Heart sounds: Normal heart sounds. No murmur heard. No friction rub. No gallop.   Pulmonary:     Effort: Pulmonary effort is normal.     Breath sounds: Normal breath sounds. No wheezing, rhonchi or rales.  Musculoskeletal:     Cervical back: Neck supple.  Skin:    General: Skin is warm.     Findings: No rash.  Neurological:     Mental Status: She is alert and oriented to person, place, and time.  Psychiatric:        Behavior: Behavior normal.    The plan was reviewed with the patient/family, and all questions/concerned were addressed.  It was my pleasure to see Amanda Swanson today and participate in her care. Please feel free to contact me with any questions or concerns.  Sincerely,  Wyline Mood, DO Allergy & Immunology  [facility] office: [phone] [facility] office: [phone]

## 2020-08-24 NOTE — Patient Instructions (Addendum)
Will review recent bloodwork results from your PCP's office.   Rhinitis:  Try Nasacort, Rhinocort, Nasonex 1-2 sprays per nostril daily for nasal symptoms.   I sent in prescription for Nasonex to see if it's covered.  If not covered, these are all available over the counter.   Nasal saline spray (i.e., Simply Saline) or nasal saline lavage (i.e., NeilMed) is recommended as needed and prior to medicated nasal sprays. Get bloodwork:  We are ordering labs, so please allow 1-2 weeks for the results to come back. With the newly implemented Cures Act, the labs might be visible to you at the same time that they become visible to me. However, I will not address the results until all of the results are back, so please be patient.  In the meantime, continue recommendations in your patient instructions, including avoidance measures (if applicable), until you hear from me.  If you have significant allergies will consider allergy injections.   Consider ENT evaluation if no improvement with above regimen.  Breathing:   May use levoalbuterol rescue inhaler 2 puffs every 4 to 6 hours as needed for shortness of breath, chest tightness, coughing, and wheezing. Monitor frequency of use.   Skin:  See below for proper skin care.  Food:  Continue to avoid peanuts, tree nuts, coconut, shellfish, apples, bananas and milk. Get bloodwork:  We are ordering labs, so please allow 1-2 weeks for the results to come back. With the newly implemented Cures Act, the labs might be visible to you at the same time that they become visible to me. However, I will not address the results until all of the results are back, so please be patient.  In the meantime, continue recommendations in your patient instructions, including avoidance measures (if applicable), until you hear from me.  I have prescribed epinephrine injectable and demonstrated proper use. For mild symptoms you can take over the counter antihistamines such  as Benadryl and monitor symptoms closely. If symptoms worsen or if you have severe symptoms including breathing issues, throat closure, significant swelling, whole body hives, severe diarrhea and vomiting, lightheadedness then inject epinephrine and seek immediate medical care afterwards.  Food action plan given.  Follow up in 2 months or sooner if needed.   Skin care recommendations  Bath time: . Always use lukewarm water. AVOID very hot or cold water. Marland Kitchen Keep bathing time to 5-10 minutes. . Do NOT use bubble bath. . Use a mild soap and use just enough to wash the dirty areas. . Do NOT scrub skin vigorously.  . After bathing, pat dry your skin with a towel. Do NOT rub or scrub the skin.  Moisturizers and prescriptions:  . ALWAYS apply moisturizers immediately after bathing (within 3 minutes). This helps to lock-in moisture. . Use the moisturizer several times a day over the whole body. Peri [place] summer moisturizers include: Aveeno, CeraVe, Cetaphil. Peri [place] winter moisturizers include: Aquaphor, Vaseline, Cerave, Cetaphil, Eucerin, Vanicream. . When using moisturizers along with medications, the moisturizer should be applied about one hour after applying the medication to prevent diluting effect of the medication or moisturize around where you applied the medications. When not using medications, the moisturizer can be continued twice daily as maintenance.  Laundry and clothing: . Avoid laundry products with added color or perfumes. . Use unscented hypo-allergenic laundry products such as Tide free, Cheer free & gentle, and All free and clear.  . If the skin still seems dry or sensitive, you can try double-rinsing the clothes. Marland Kitchen  Avoid tight or scratchy clothing such as wool. . Do not use fabric softeners or dyer sheets.

## 2020-08-25 ENCOUNTER — Encounter: Payer: Self-pay | Admitting: Allergy

## 2020-08-25 DIAGNOSIS — J310 Chronic rhinitis: Secondary | ICD-10-CM | POA: Insufficient documentation

## 2020-08-25 DIAGNOSIS — R0602 Shortness of breath: Secondary | ICD-10-CM | POA: Insufficient documentation

## 2020-08-25 DIAGNOSIS — L299 Pruritus, unspecified: Secondary | ICD-10-CM | POA: Insufficient documentation

## 2020-08-25 NOTE — Assessment & Plan Note (Signed)
Shortness of breath and coughing episodes for 1 year. No prior inhaler use or asthma diagnosis.   Today's spirometry was not of ideal effort and no improvement noted after bronchodilator treatment. Clinically feeling unchanged.  Will send in levoalbuterol due to her issues with palpitations and anxiety history.   May use levoalbuterol rescue inhaler 2 puffs every 4 to 6 hours as needed for shortness of breath, chest tightness, coughing, and wheezing. Monitor frequency of use.

## 2020-08-25 NOTE — Assessment & Plan Note (Signed)
Pruritus with no known triggers. Had recent bloodwork from PCP - in process of requesting records.  See below for proper skin care.

## 2020-08-25 NOTE — Assessment & Plan Note (Signed)
Patient avoiding several foods and has IBS as well. Most recently bananas caused diarrhea. Apples, peanuts, tree nuts, coconut - perioral symptoms. Shellfish - positive on skin testing apparently but not sure of clinical reactions. Dairy - GI symptoms.   Continue to avoid peanuts, tree nuts, coconut, shellfish, apples, bananas and milk. Get bloodwork.  I have prescribed epinephrine injectable and demonstrated proper use. For mild symptoms you can take over the counter antihistamines such as Benadryl and monitor symptoms closely. If symptoms worsen or if you have severe symptoms including breathing issues, throat closure, significant swelling, whole body hives, severe diarrhea and vomiting, lightheadedness then inject epinephrine and seek immediate medical care afterwards.  Food action plan given.

## 2020-08-25 NOTE — Assessment & Plan Note (Signed)
Perennial symptoms of shortness of breath, itchy eyes, ear pain with minimal rhinitis which has been worsening the past 9 years and during the spring. Other triggers include exposure to strong scents. Antihistamines caused palpitations in the past. Not sure if Flonase was helpful. Not interested in allergy skin testing today or immunotherapy. Takes a naturopathic medication called aller-7 with some benefit. Denies reflux. No prior ENT evaluation.  Patient prefers bloodwork over skin testing - ordered environmental allergy panel and will make additional recommendations based on results.   Try Nasacort, Rhinocort, Nasonex 1-2 sprays per nostril daily for nasal symptoms.   I sent in prescription for Nasonex to see if it's covered.  If not covered, these are all available over the counter.   Avoid Flonase due to flowery scent.   Nasal saline spray (i.e., Simply Saline) or nasal saline lavage (i.e., NeilMed) is recommended as needed and prior to medicated nasal sprays.  Consider ENT evaluation if no improvement with above regimen.

## 2020-08-28 LAB — IGE NUT PROF. W/COMPONENT RFLX
F017-IgE Hazelnut (Filbert): <0.1 kU/L
F018-IgE Brazil Nut: <0.1 kU/L
F020-IgE Almond: <0.1 kU/L
F202-IgE Cashew Nut: <0.1 kU/L
F203-IgE Pistachio Nut: <0.1 kU/L
F256-IgE Walnut: <0.1 kU/L
Macadamia Nut, IgE: <0.1 kU/L
Peanut, IgE: <0.1 kU/L
Pecan Nut IgE: <0.1 kU/L

## 2020-08-28 LAB — ALLERGEN PROFILE, SHELLFISH
Clam IgE: 4.44 kU/L — AB
F023-IgE Crab: 4.61 kU/L — AB
F080-IgE Lobster: 6.76 kU/L — AB
F290-IgE Oyster: 1.61 kU/L — AB
Scallop IgE: 3.24 kU/L — AB
Shrimp IgE: 6.78 kU/L — AB

## 2020-08-28 LAB — ALLERGENS W/TOTAL IGE AREA 2
Alternaria Alternata IgE: <0.1 kU/L
Aspergillus Fumigatus IgE: <0.1 kU/L
Bermuda Grass IgE: <0.1 kU/L
Cat Dander IgE: <0.1 kU/L
Cedar, Mountain IgE: 1.23 kU/L — AB
Cladosporium Herbarum IgE: <0.1 kU/L
Cockroach, German IgE: 3.52 kU/L — AB
Common Silver Birch IgE: <0.1 kU/L
Cottonwood IgE: <0.1 kU/L
D Farinae IgE: 6.99 kU/L — AB
D Pteronyssinus IgE: 4.75 kU/L — AB
Dog Dander IgE: 0.1 kU/L — AB
Elm, American IgE: 0.12 kU/L — AB
IgE (Immunoglobulin E), Serum: 608 IU/mL — ABNORMAL HIGH (ref 6–495)
Johnson Grass IgE: <0.1 kU/L
Maple/Box Elder IgE: <0.1 kU/L
Mouse Urine IgE: <0.1 kU/L
Oak, White IgE: <0.1 kU/L
Pecan, Hickory IgE: <0.1 kU/L
Penicillium Chrysogen IgE: <0.1 kU/L
Pigweed, Rough IgE: <0.1 kU/L
Ragweed, Short IgE: 1.23 kU/L — AB
Sheep Sorrel IgE Qn: 0.15 kU/L — AB
Timothy Grass IgE: <0.1 kU/L
White Mulberry IgE: <0.1 kU/L

## 2020-08-28 LAB — ALLERGEN, APPLE F49: Allergen Apple, IgE: <0.1 kU/L

## 2020-08-28 LAB — ALLERGEN BANANA: Allergen Banana IgE: 0.21 kU/L — AB

## 2020-08-28 LAB — IGE MILK W/ COMPONENT REFLEX: F002-IgE Milk: <0.1 kU/L

## 2020-08-31 ENCOUNTER — Encounter: Payer: Self-pay | Admitting: Allergy

## 2020-08-31 NOTE — Progress Notes (Signed)
06/24/2020 bloodwork: See scanned report. CMP, cbc diff, TSH - unremarkable.

## 2020-09-13 IMAGING — US US THYROID
1 series · 14 of 25 positions shown · non-contrast
Comparison: 05/06/2016

CLINICAL DATA: Goiter.

EXAM:
THYROID ULTRASOUND
TECHNIQUE: Ultrasound examination of the thyroid gland and adjacent soft
tissues was performed.

[Series 1: us thyroid · 0.04mm/px · 14 of 51 slices shown]
[im 1/51]
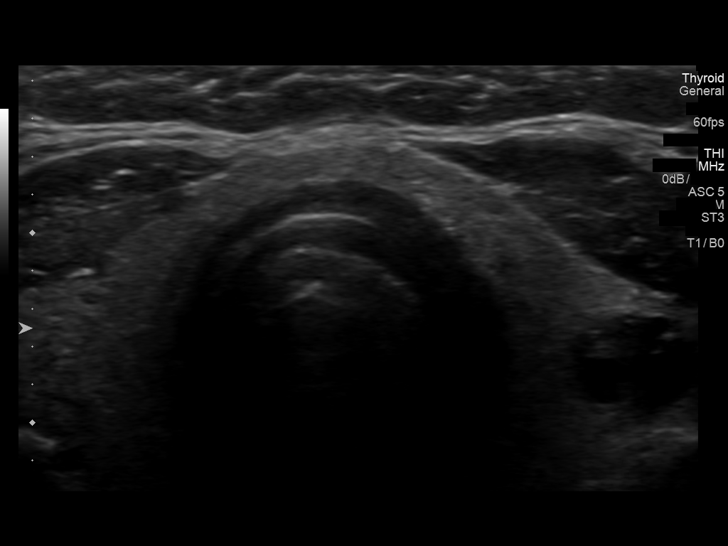
[im 5/51]
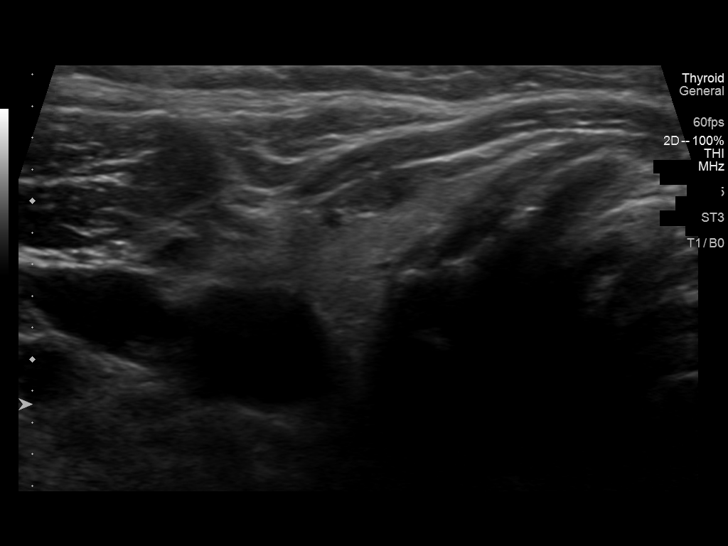
[im 9/51]
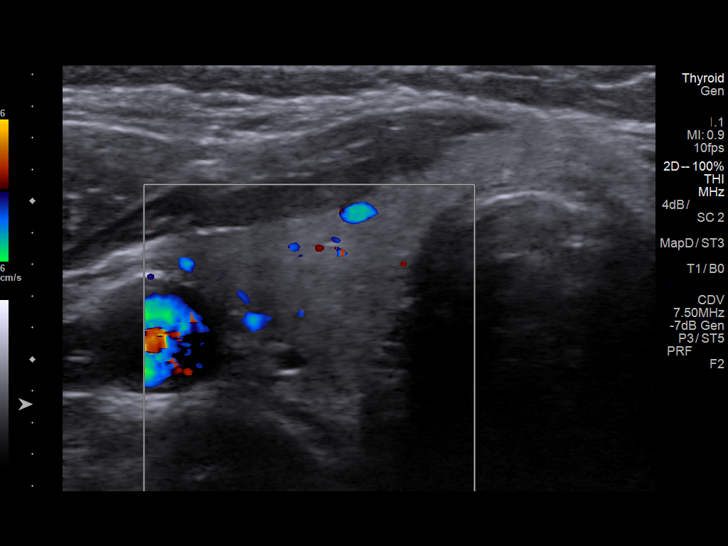
[im 13/51]
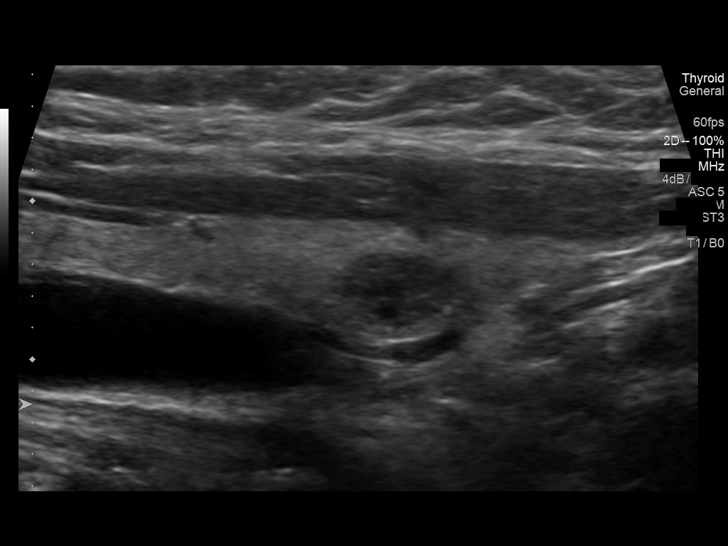
[im 17/51]
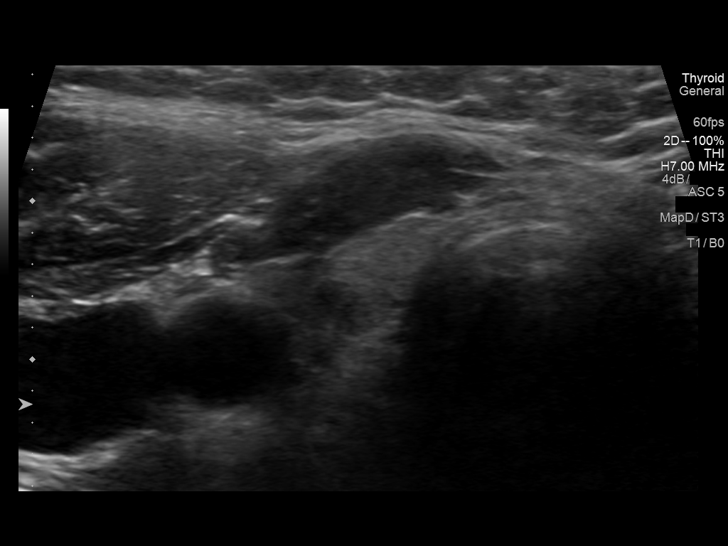
[im 19/51]
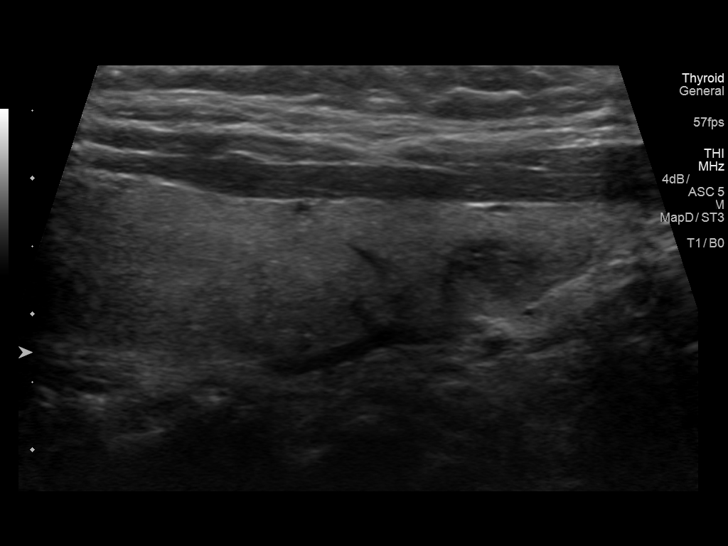
[im 23/51]
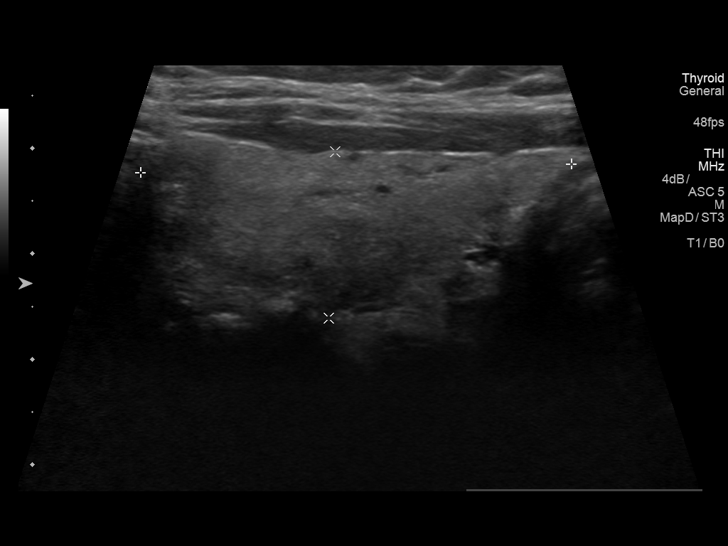
[im 28/51]
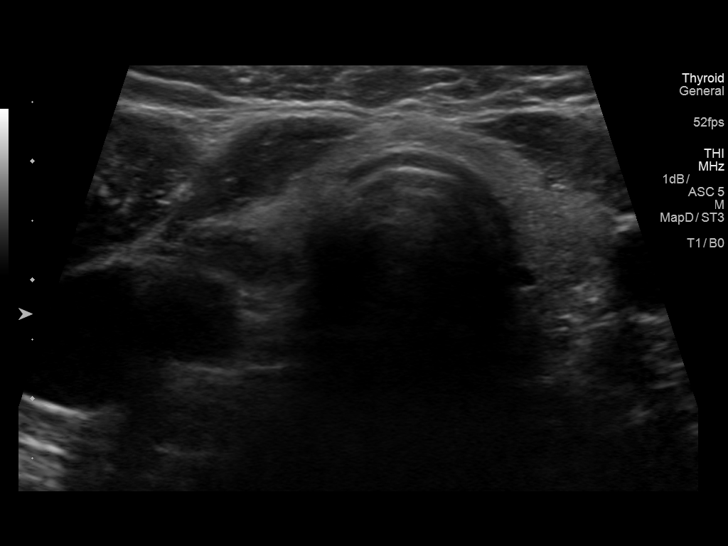
[im 32/51]
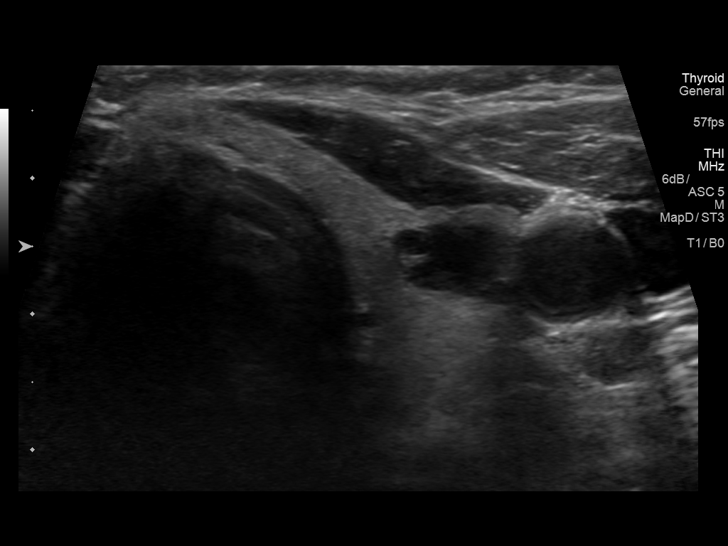
[im 34/51]
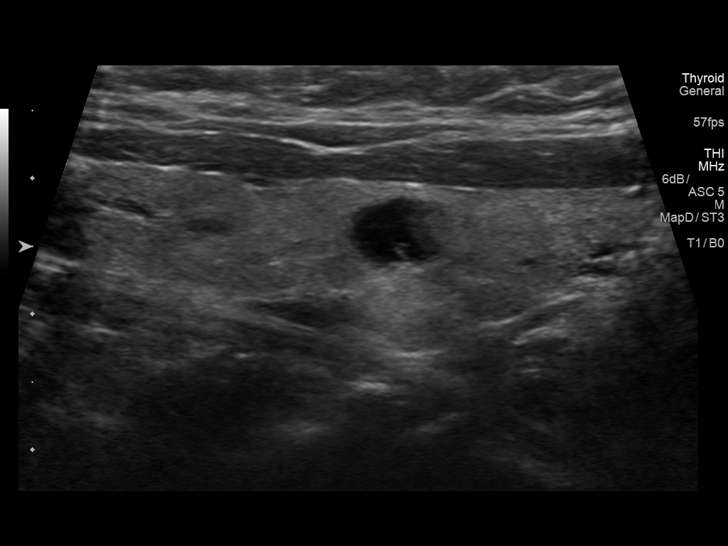
[im 38/51]
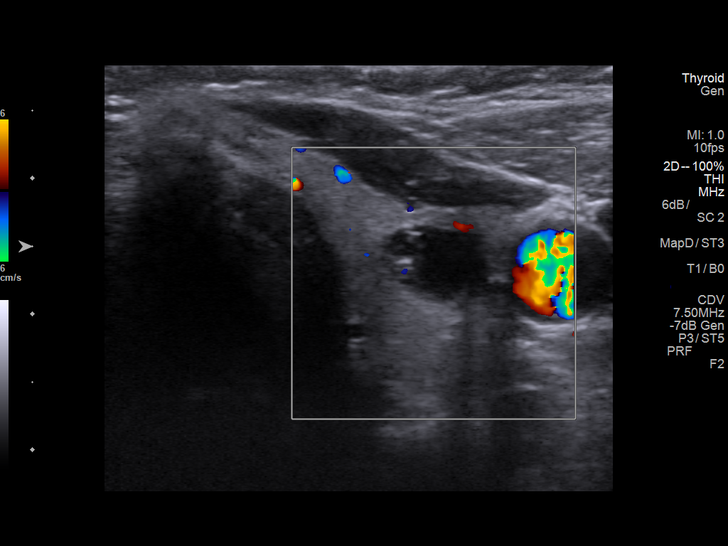
[im 42/51]
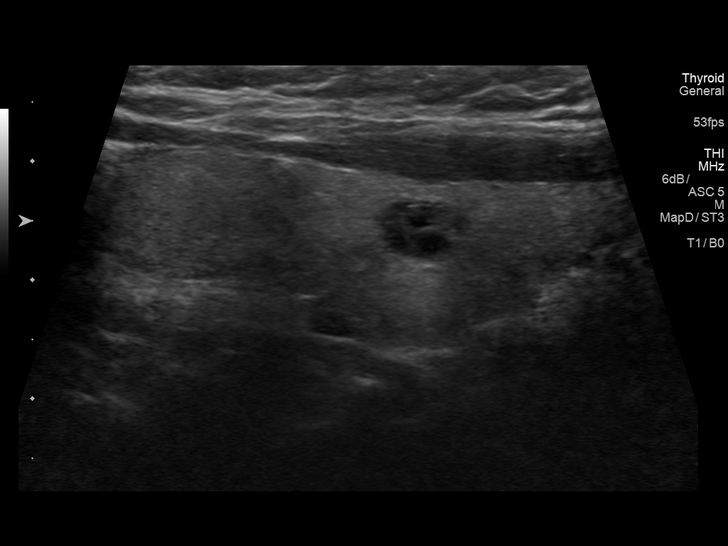
[im 46/51]
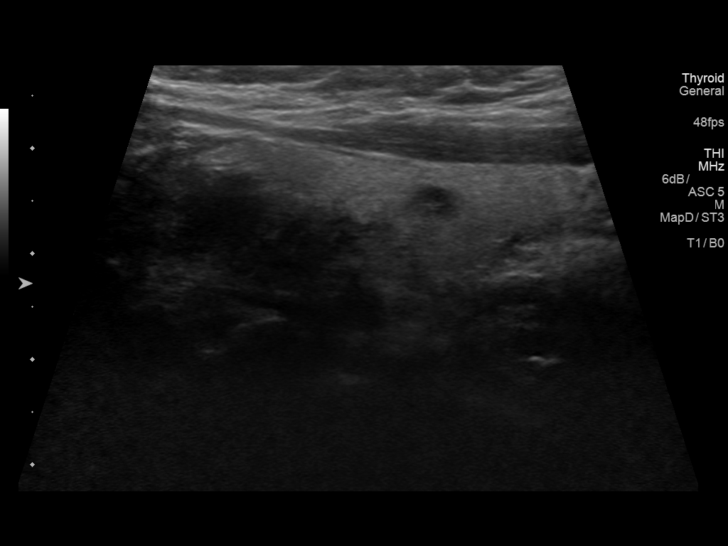
[im 51/51]
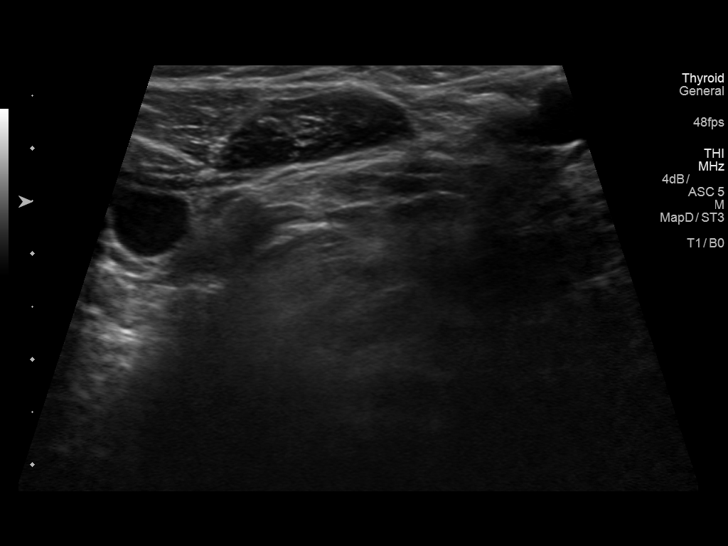

[14 of 25 positions shown; findings below may reference images not displayed]

FINDINGS: Parenchymal Echotexture: Mildly heterogenous

Isthmus: 0.2 cm, previously 0.3 cm

Right lobe: 4.1 x 1.6 x 1.4 cm, previously 4.4 x 1.2 x 1.7 cm

Left lobe: 4.4 x 1.4 x 1.3 cm, previously 4.4 x 1.2 x 1.6 cm

_________________________________________________________

Estimated total number of nodules >/= 1 cm: 0

Number of spongiform nodules >/=  2 cm not described below (TR1): 0

Number of mixed cystic and solid nodules >/= 1.5 cm not described
below (TR2): 0

_________________________________________________________

Bilateral nodules measure 0.8 cm or less and do not meet criteria
for biopsy nor follow-up. They are not significantly changed.
IMPRESSION: There are no nodules which meet criteria for biopsy nor follow-up.
No significant change.

The above is in keeping with the ACR TI-RADS recommendations - [HOSPITAL] 8007;[DATE].

## 2020-11-04 ENCOUNTER — Other Ambulatory Visit (HOSPITAL_BASED_OUTPATIENT_CLINIC_OR_DEPARTMENT_OTHER): Payer: Self-pay

## 2020-11-05 ENCOUNTER — Ambulatory Visit: Payer: Medicare Other | Attending: Internal Medicine

## 2020-11-05 ENCOUNTER — Other Ambulatory Visit: Payer: Self-pay

## 2020-11-05 ENCOUNTER — Other Ambulatory Visit (HOSPITAL_BASED_OUTPATIENT_CLINIC_OR_DEPARTMENT_OTHER): Payer: Self-pay

## 2020-11-05 DIAGNOSIS — Z23 Encounter for immunization: Secondary | ICD-10-CM

## 2020-11-05 MED ORDER — COVID-19 MRNA VACC (MODERNA) 100 MCG/0.5ML IM SUSP
INTRAMUSCULAR | 0 refills | Status: DC
  Filled 2020-11-05: qty 0.25, 1d supply, fill #0

## 2020-11-05 NOTE — Progress Notes (Signed)
   Covid-19 Vaccination Clinic  Name:  Amanda Swanson    MRN: [id number] DOB: [birth year]  11/05/2020  [name] was observed post Covid-19 immunization for 15 minutes without incident. She was provided with Vaccine Information Sheet and instruction to access the V-Safe system.   [name] was instructed to call 911 with any severe reactions post vaccine: Marland Kitchen Difficulty breathing  . Swelling of face and throat  . A fast heartbeat  . A bad rash all over body  . Dizziness and weakness   Immunizations Administered    Name Date Dose VIS Date Route   Moderna Covid-19 Booster Vaccine 11/05/2020  3:00 PM 0.25 mL 05/06/2020 Intramuscular   Manufacturer: Moderna   Lot: [id number]   NDC: 20100-712-19

## 2020-11-12 ENCOUNTER — Ambulatory Visit (INDEPENDENT_AMBULATORY_CARE_PROVIDER_SITE_OTHER): Payer: Medicare Other | Admitting: Obstetrics & Gynecology

## 2020-11-12 ENCOUNTER — Encounter: Payer: Self-pay | Admitting: Obstetrics & Gynecology

## 2020-11-12 ENCOUNTER — Other Ambulatory Visit (HOSPITAL_COMMUNITY)
Admission: RE | Admit: 2020-11-12 | Discharge: 2020-11-12 | Disposition: A | Discharge status: Home / Self Care | Payer: Medicare Other | Source: Ambulatory Visit | Attending: Obstetrics & Gynecology | Admitting: Obstetrics & Gynecology

## 2020-11-12 ENCOUNTER — Other Ambulatory Visit: Payer: Self-pay

## 2020-11-12 VITALS — BP 120/80 | Ht 65.0 in | Wt 160.0 lb

## 2020-11-12 DIAGNOSIS — Z01419 Encounter for gynecological examination (general) (routine) without abnormal findings: Secondary | ICD-10-CM | POA: Insufficient documentation

## 2020-11-12 DIAGNOSIS — Z780 Asymptomatic menopausal state: Secondary | ICD-10-CM

## 2020-11-12 DIAGNOSIS — M8589 Other specified disorders of bone density and structure, multiple sites: Secondary | ICD-10-CM | POA: Diagnosis not present

## 2020-11-12 DIAGNOSIS — R87610 Atypical squamous cells of undetermined significance on cytologic smear of cervix (ASC-US): Secondary | ICD-10-CM

## 2020-11-12 NOTE — Progress Notes (Signed)
Amanda Swanson 09-23-1950 [id number]   History:    70 y.o. G4P3A1L3Married  IO:[id number] presenting for annual gyn exam   OZD:[id number], well on no HRT. No PMB.No pelvic pain. Normal vaginal secretions.  No pain with IC. Urine normal. IBS with alternating between loose stools and constipation. Breasts normal. Screening Mammo at [facility] 01/2020 Negative.  Body mass index 26.63.  Good fitness.  Followed by Dr Evlyn Kanner.  Last HBA1C was 5.1.  Colono scheduled next week.  Bone Density Osteopenia T-Score -1.7 in 11/2019.    Past medical history,surgical history, family history and social history were all reviewed and documented in the EPIC chart.  Gynecologic History No LMP recorded. Patient is postmenopausal.   Obstetric History OB History  Gravida Para Term Preterm AB Living  4 3     1 3   SAB IAB Ectopic Multiple Live Births  1            # Outcome Date GA Lbr Len/2nd Weight Sex Delivery Anes PTL Lv  4 SAB           3 Para           2 Para           1 Para              ROS: A ROS was performed and pertinent positives and negatives are included in the history.  GENERAL: No fevers or chills. HEENT: No change in vision, no earache, sore throat or sinus congestion. NECK: No pain or stiffness. CARDIOVASCULAR: No chest pain or pressure. No palpitations. PULMONARY: No shortness of breath, cough or wheeze. GASTROINTESTINAL: No abdominal pain, nausea, vomiting or diarrhea, melena or bright red blood per rectum. GENITOURINARY: No urinary frequency, urgency, hesitancy or dysuria. MUSCULOSKELETAL: No joint or muscle pain, no back pain, no recent trauma. DERMATOLOGIC: No rash, no itching, no lesions. ENDOCRINE: No polyuria, polydipsia, no heat or cold intolerance. No recent change in weight. HEMATOLOGICAL: No anemia or easy bruising or bleeding. NEUROLOGIC: No headache, seizures, numbness, tingling or weakness. PSYCHIATRIC: No depression, no loss of interest in normal  activity or change in sleep pattern.     Exam:   BP 120/80 (BP Location: Right Arm, Cuff Size: Normal)   Ht 5\' 5"  (1.651 m)   Wt 160 lb (72.6 kg)   BMI 26.63 kg/m   Body mass index is 26.63 kg/m.  General appearance : Well developed well nourished female. No acute distress HEENT: Eyes: no retinal hemorrhage or exudates,  Neck supple, trachea midline, no carotid bruits, no thyroidmegaly Lungs: Clear to auscultation, no rhonchi or wheezes, or rib retractions  Heart: Regular rate and rhythm, no murmurs or gallops Breast:Examined in sitting and supine position were symmetrical in appearance, no palpable masses or tenderness,  no skin retraction, no nipple inversion, no nipple discharge, no skin discoloration, no axillary or supraclavicular lymphadenopathy Abdomen: no palpable masses or tenderness, no rebound or guarding Extremities: no edema or skin discoloration or tenderness  Pelvic: Vulva: Normal             Vagina: No gross lesions or discharge  Cervix: No gross lesions or discharge.  Pap reflex done.  Uterus  AV, normal size, shape and consistency, non-tender and mobile  Adnexa  Without masses or tenderness  Anus: Normal   Assessment/Plan:  70 y.o. female for annual exam   1. Encounter for routine gynecological examination with Papanicolaou smear of cervix Normal gynecologic exam in menopause.  Pap reflex done.  Breasts normal.  Screening mammo 01/2020 Neg.  Colono scheduled next week.  BMI 26.63.  Continue with fitness and healthy nutrition.  Health labs with Dr Evlyn Kanner. - Cytology - PAP( [facility])  2. ASCUS of cervix with negative high risk HPV Pap Reflex done.  3. Postmenopausal Well on no HRT.  No PMP.  4. Osteopenia of multiple sites BD 11/2019 Osteopenia T-Score -1.7 at [facility].  Continue with Vit D, Ca++ 1.5 g/d total, regular weight bearing physical activities.  Genia Del MD, 3:06 PM 11/12/2020

## 2020-11-13 ENCOUNTER — Encounter: Payer: Self-pay | Admitting: Obstetrics & Gynecology

## 2020-11-13 LAB — CYTOLOGY - PAP: Diagnosis: NEGATIVE

## 2021-04-28 ENCOUNTER — Ambulatory Visit: Payer: Medicare Other | Attending: Internal Medicine

## 2021-04-28 DIAGNOSIS — Z23 Encounter for immunization: Secondary | ICD-10-CM

## 2021-04-28 NOTE — Progress Notes (Signed)
   Covid-19 Vaccination Clinic  Name:  Amanda Swanson    MRN: [id number] DOB: 1951/01/04  04/28/2021  Amanda Swanson was observed post Covid-19 immunization for 15 minutes without incident. She was provided with Vaccine Information Sheet and instruction to access the V-Safe system.   Amanda Swanson was instructed to call 911 with any severe reactions post vaccine: Difficulty breathing  Swelling of face and throat  A fast heartbeat  A bad rash all over body  Dizziness and weakness

## 2021-05-17 ENCOUNTER — Other Ambulatory Visit (HOSPITAL_BASED_OUTPATIENT_CLINIC_OR_DEPARTMENT_OTHER): Payer: Self-pay

## 2021-05-17 MED ORDER — MODERNA COVID-19 BIVAL BOOSTER 50 MCG/0.5ML IM SUSP
INTRAMUSCULAR | 0 refills | Status: DC
  Filled 2021-05-17: qty 0.5, 1d supply, fill #0

## 2022-04-11 ENCOUNTER — Other Ambulatory Visit (HOSPITAL_COMMUNITY): Payer: Self-pay

## 2022-04-11 MED ORDER — NEBIVOLOL HCL 10 MG PO TABS
10.0000 mg | ORAL_TABLET | Freq: Every day | ORAL | 99 refills | Status: DC
  Filled 2022-04-11: qty 90, 90d supply, fill #0
  Filled 2022-11-17: qty 30, 30d supply, fill #1

## 2022-04-12 ENCOUNTER — Other Ambulatory Visit (HOSPITAL_COMMUNITY): Payer: Self-pay

## 2022-11-17 ENCOUNTER — Other Ambulatory Visit (HOSPITAL_COMMUNITY): Payer: Self-pay

## 2022-11-18 ENCOUNTER — Other Ambulatory Visit (HOSPITAL_COMMUNITY): Payer: Self-pay

## 2023-01-10 ENCOUNTER — Other Ambulatory Visit (HOSPITAL_COMMUNITY): Payer: Self-pay

## 2023-01-10 MED ORDER — NEBIVOLOL HCL 10 MG PO TABS
10.0000 mg | ORAL_TABLET | Freq: Every day | ORAL | 4 refills | Status: DC
  Filled 2023-01-10: qty 90, 90d supply, fill #0
  Filled 2023-06-19: qty 90, 90d supply, fill #1
  Filled 2023-11-09: qty 30, 30d supply, fill #2

## 2023-01-11 ENCOUNTER — Other Ambulatory Visit (HOSPITAL_COMMUNITY): Payer: Self-pay

## 2023-01-13 ENCOUNTER — Ambulatory Visit (INDEPENDENT_AMBULATORY_CARE_PROVIDER_SITE_OTHER): Payer: Medicare Other | Admitting: Obstetrics & Gynecology

## 2023-01-13 ENCOUNTER — Encounter: Payer: Self-pay | Admitting: Obstetrics & Gynecology

## 2023-01-13 VITALS — BP 122/82 | HR 79 | Ht 65.5 in | Wt 161.0 lb

## 2023-01-13 DIAGNOSIS — Z9189 Other specified personal risk factors, not elsewhere classified: Secondary | ICD-10-CM | POA: Diagnosis not present

## 2023-01-13 DIAGNOSIS — Z8742 Personal history of other diseases of the female genital tract: Secondary | ICD-10-CM | POA: Diagnosis not present

## 2023-01-13 DIAGNOSIS — M8589 Other specified disorders of bone density and structure, multiple sites: Secondary | ICD-10-CM

## 2023-01-13 DIAGNOSIS — Z780 Asymptomatic menopausal state: Secondary | ICD-10-CM

## 2023-01-13 DIAGNOSIS — Z01419 Encounter for gynecological examination (general) (routine) without abnormal findings: Secondary | ICD-10-CM

## 2023-01-13 NOTE — Progress Notes (Signed)
Amanda Swanson 01-Jul-1951 [id number]   History:    72 y.o.  [id number] Married.  Grand-mother x 1.   RP:  Established patient presenting for annual gyn exam    HPI:  Postmenopause, well on no HRT.  No PMB.  No pelvic pain. Normal vaginal secretions.  No pain with IC. Pap 10/2020 Neg.  Urine normal.  IBS with alternating between loose stools and constipation. Breasts normal. Screening Mammo at [facility] 03/2022 Negative.  Body mass index 26.38. Good fitness.  Followed by Dr Evlyn Kanner.  Colono 2022.  Bone Density Osteopenia T-Score -1.7 in 11/2019.     Past medical history,surgical history, family history and social history were all reviewed and documented in the EPIC chart.  Gynecologic History No LMP recorded. Patient is postmenopausal.  Obstetric History OB History  Gravida Para Term Preterm AB Living  4 3     1 3   SAB IAB Ectopic Multiple Live Births  1            # Outcome Date GA Lbr Len/2nd Weight Sex Delivery Anes PTL Lv  4 SAB           3 Para           2 Para           1 Para              ROS: A ROS was performed and pertinent positives and negatives are included in the history. GENERAL: No fevers or chills. HEENT: No change in vision, no earache, sore throat or sinus congestion. NECK: No pain or stiffness. CARDIOVASCULAR: No chest pain or pressure. No palpitations. PULMONARY: No shortness of breath, cough or wheeze. GASTROINTESTINAL: No abdominal pain, nausea, vomiting or diarrhea, melena or bright red blood per rectum. GENITOURINARY: No urinary frequency, urgency, hesitancy or dysuria. MUSCULOSKELETAL: No joint or muscle pain, no back pain, no recent trauma. DERMATOLOGIC: No rash, no itching, no lesions. ENDOCRINE: No polyuria, polydipsia, no heat or cold intolerance. No recent change in weight. HEMATOLOGICAL: No anemia or easy bruising or bleeding. NEUROLOGIC: No headache, seizures, numbness, tingling or weakness. PSYCHIATRIC: No depression, no loss of interest in normal activity  or change in sleep pattern.     Exam:   BP 122/82 (BP Location: Right Arm, Patient Position: Sitting, Cuff Size: Normal)   Pulse 79   Ht 5' 5.5" (1.664 m)   Wt 161 lb (73 kg)   SpO2 98%   BMI 26.38 kg/m   Body mass index is 26.38 kg/m.  General appearance : Well developed well nourished female. No acute distress HEENT: Eyes: no retinal hemorrhage or exudates,  Neck supple, trachea midline, no carotid bruits, no thyroidmegaly Lungs: Clear to auscultation, no rhonchi or wheezes, or rib retractions  Heart: Regular rate and rhythm, no murmurs or gallops Breast:Examined in sitting and supine position were symmetrical in appearance, no palpable masses or tenderness,  no skin retraction, no nipple inversion, no nipple discharge, no skin discoloration, no axillary or supraclavicular lymphadenopathy Abdomen: no palpable masses or tenderness, no rebound or guarding Extremities: no edema or skin discoloration or tenderness  Pelvic: Vulva: Normal             Vagina: No gross lesions or discharge  Cervix: No gross lesions or discharge  Uterus  AV, normal size, shape and consistency, non-tender and mobile  Adnexa  Without masses or tenderness  Anus: Normal   Assessment/Plan:  72 y.o. female for annual exam   1. Well  female exam with routine gynecological exam Postmenopause, well on no HRT.  No PMB.  No pelvic pain. Normal vaginal secretions.  No pain with IC. Pap 10/2020 Neg.  Urine normal. IBS with alternating between loose stools and constipation. Breasts normal. Screening Mammo at [facility] 03/2022 Negative.  Body mass index 26.38. Good fitness.  Followed by Dr Evlyn Kanner.  Colono 2022.  Bone Density Osteopenia T-Score -1.7 in 11/2019.     2. Postmenopause Postmenopause, well on no HRT.  No PMB.  No pelvic pain. Normal vaginal secretions.  No pain with IC.  3. Osteopenia of multiple sites Bone Density Osteopenia T-Score -1.7 in 11/2019. Repeat BD here now. - DG Bone Density; Future   Genia Del MD, 2:22 PM

## 2023-01-24 ENCOUNTER — Other Ambulatory Visit: Payer: Self-pay | Admitting: Obstetrics & Gynecology

## 2023-01-24 ENCOUNTER — Ambulatory Visit (INDEPENDENT_AMBULATORY_CARE_PROVIDER_SITE_OTHER): Payer: Medicare Other

## 2023-01-24 DIAGNOSIS — Z01419 Encounter for gynecological examination (general) (routine) without abnormal findings: Secondary | ICD-10-CM

## 2023-01-24 DIAGNOSIS — Z780 Asymptomatic menopausal state: Secondary | ICD-10-CM

## 2023-01-24 DIAGNOSIS — Z13820 Encounter for screening for osteoporosis: Secondary | ICD-10-CM | POA: Diagnosis not present

## 2023-01-24 DIAGNOSIS — M8589 Other specified disorders of bone density and structure, multiple sites: Secondary | ICD-10-CM

## 2023-05-08 ENCOUNTER — Ambulatory Visit: Payer: Medicare Other | Admitting: Podiatry

## 2023-05-08 ENCOUNTER — Encounter: Payer: Self-pay | Admitting: Podiatry

## 2023-05-08 ENCOUNTER — Ambulatory Visit (INDEPENDENT_AMBULATORY_CARE_PROVIDER_SITE_OTHER): Payer: Medicare Other

## 2023-05-08 VITALS — Ht 65.5 in | Wt 161.0 lb

## 2023-05-08 DIAGNOSIS — G8929 Other chronic pain: Secondary | ICD-10-CM

## 2023-05-08 DIAGNOSIS — M7662 Achilles tendinitis, left leg: Secondary | ICD-10-CM | POA: Diagnosis not present

## 2023-05-08 DIAGNOSIS — M79672 Pain in left foot: Secondary | ICD-10-CM | POA: Diagnosis not present

## 2023-05-08 DIAGNOSIS — M62462 Contracture of muscle, left lower leg: Secondary | ICD-10-CM

## 2023-05-08 NOTE — Patient Instructions (Signed)
VISIT SUMMARY:  During your visit, we discussed your ongoing left heel pain. After examining your heel and reviewing your X-ray, it appears you have Achilles Tendonitis, which is inflammation of the tendon at the back of your heel.   YOUR PLAN:  -ACHILLES TENDONITIS: To help your heel heal, you'll need to rest it as much as possible. You'll wear a walking boot for the next 3-4 weeks to help with this. You'll also apply a gel called Voltaren to your heel four times a day for a week to help reduce inflammation. If this doesn't help, we may consider a medication called meloxicam. You'll also do some physical therapy exercises at home twice a day. We'll check on your progress in about 6 weeks.    INSTRUCTIONS:  Remember to wear your walking boot as much as possible for the next 3-4 weeks. Apply the Voltaren gel to your heel four times a day for a week. If the gel doesn't help with the pain, let us know, as we may consider a different medication. Do your physical therapy exercises at home twice a day. We'll see you again in about 6 weeks to check on your progress.

## 2023-05-09 NOTE — Progress Notes (Signed)
  Subjective:  Patient ID: Amanda Swanson, female    DOB: Aug 08, 1950,  MRN: [id number]  Chief Complaint  Patient presents with   Heel Spurs    RM22: patient is here for left heel pain for 3 weeks    Discussed the use of AI scribe software for clinical note transcription with the patient, who gave verbal consent to proceed.  History of Present Illness   The patient presents with a several week history of left heel pain, which is worse in the morning and after climbing stairs. The pain is located at the back of the heel and is associated with tightness. The patient denies any previous episodes of heel pain. The patient also reports a high instep and has been trying to do exercises at home.          Objective:    Physical Exam   EXTREMITIES: Foot warm and well-perfused with palpable pulses. MUSCULOSKELETAL: Pain on palpation of the posterior heel and mid-substance of the Achilles tendon. Gastrocnemius equinus present. No palpable delve or rupture in Achilles tendon. 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           Results   RADIOLOGY Left foot X-ray: Haglund deformity, small posterior calcaneal enthesophyte, mild pes cavus foot deformity with metatarsus adductus (05/08/2023)      Assessment:   1. Achilles tendinitis of left lower extremity      Plan:  Patient was evaluated and treated and all questions answered.  Assessment and Plan    Achilles Tendonitis   She exhibits pain and tenderness in the Achilles tendon and posterior heel, which worsens with walking and upon waking. An X-ray revealed a small posterior calcaneal enthesophyte. We discussed the importance of rest, inflammation control, and physical therapy. She will use a walking boot for 3-4 weeks to rest the tendon and apply Voltaren gel four times daily for a week to control inflammation. If Voltaren is not  effective, we will consider oral meloxicam. She is to perform prescribed physical therapy exercises twice daily at home. A follow-up in 6 weeks will assess progress.          No follow-ups on file.

## 2023-06-19 ENCOUNTER — Ambulatory Visit (INDEPENDENT_AMBULATORY_CARE_PROVIDER_SITE_OTHER): Payer: Medicare Other | Admitting: Podiatry

## 2023-06-19 ENCOUNTER — Encounter: Payer: Self-pay | Admitting: Podiatry

## 2023-06-19 ENCOUNTER — Other Ambulatory Visit: Payer: Self-pay

## 2023-06-19 DIAGNOSIS — M7662 Achilles tendinitis, left leg: Secondary | ICD-10-CM | POA: Diagnosis not present

## 2023-06-19 DIAGNOSIS — M62462 Contracture of muscle, left lower leg: Secondary | ICD-10-CM

## 2023-06-20 ENCOUNTER — Encounter: Payer: Self-pay | Admitting: Podiatry

## 2023-06-20 ENCOUNTER — Other Ambulatory Visit: Payer: Self-pay

## 2023-06-20 ENCOUNTER — Other Ambulatory Visit (HOSPITAL_COMMUNITY): Payer: Self-pay

## 2023-06-20 NOTE — Progress Notes (Signed)
  Subjective:  Patient ID: Amanda Swanson, female    DOB: 1951/02/27,  MRN: [id number]  Chief Complaint  Patient presents with   Foot Pain    Left foot achilles tendoitis, "she is still having some pain but imporoved"      Discussed the use of AI scribe software for clinical note transcription with the patient, who gave verbal consent to proceed.  History of Present Illness   Pain is improving she says its about 40% better than it was         Objective:    Physical Exam   EXTREMITIES: Foot warm and well-perfused with palpable pulses. MUSCULOSKELETAL: Pain on palpation of the posterior heel today not in the mid-substance of the Achilles tendon. Gastrocnemius equinus present.         Results   RADIOLOGY Left foot X-ray: Haglund deformity, small posterior calcaneal enthesophyte, mild pes cavus foot deformity with metatarsus adductus (05/08/2023)      Assessment:   Encounter Diagnoses  Name Primary?   Achilles tendinitis of left lower extremity Yes   Gastrocnemius equinus of left lower extremity      Plan:  Patient was evaluated and treated and all questions answered.  Assessment and Plan    Achilles Tendonitis   Thank quite a bit of improvement, I recommended formal physical therapy accelerate her progress.  Referral sent to [facility].  Continues in Voltaren gel and home exercise plan.  Return in 2 months to follow-up if still painful  Return in about 2 months (around 08/20/2023) for re-check Achilles tendon.

## 2023-07-06 ENCOUNTER — Ambulatory Visit (HOSPITAL_BASED_OUTPATIENT_CLINIC_OR_DEPARTMENT_OTHER): Payer: Medicare Other | Attending: Podiatry | Admitting: Physical Therapy

## 2023-07-06 DIAGNOSIS — M62462 Contracture of muscle, left lower leg: Secondary | ICD-10-CM | POA: Diagnosis not present

## 2023-07-06 DIAGNOSIS — M25672 Stiffness of left ankle, not elsewhere classified: Secondary | ICD-10-CM | POA: Diagnosis present

## 2023-07-06 DIAGNOSIS — R262 Difficulty in walking, not elsewhere classified: Secondary | ICD-10-CM | POA: Diagnosis present

## 2023-07-06 DIAGNOSIS — M25572 Pain in left ankle and joints of left foot: Secondary | ICD-10-CM | POA: Diagnosis present

## 2023-07-06 DIAGNOSIS — M7662 Achilles tendinitis, left leg: Secondary | ICD-10-CM | POA: Insufficient documentation

## 2023-07-06 NOTE — Therapy (Signed)
OUTPATIENT PHYSICAL THERAPY LOWER EXTREMITY EVALUATION   Patient Name: Amanda Swanson MRN: [id number] DOB:[birth year], 72 y.o., female Today's Date: 07/07/2023  END OF SESSION:  PT End of Session - 07/06/23 0902     Visit Number 1    Number of Visits 9    Date for PT Re-Evaluation 08/17/23    Authorization Type [employer] MCR    PT Start Time 0855    PT Stop Time 0950    PT Time Calculation (min) 55 min    Activity Tolerance Patient tolerated treatment well    Behavior During Therapy [facility] for tasks assessed/performed             Past Medical History:  Diagnosis Date   Allergy    Anxiety    Cataract    Heart murmur    Hypertension    IBS (irritable bowel syndrome)    Osteopenia 10/2017   T score -2.0 FRAX 3.6% / 0.3%   Urticaria    Past Surgical History:  Procedure Laterality Date   BREAST SURGERY     Patient Active Problem List   Diagnosis Date Noted   Chronic rhinitis 08/25/2020   Pruritus 08/25/2020   Shortness of breath 08/25/2020   Adverse reaction to food, subsequent encounter 08/24/2020   Allergic rhinitis 04/14/2015   Food allergy 04/14/2015   Oral allergy syndrome 04/14/2015   Palpitations 04/03/2013   hyperlipidemia 02/10/2012   Anxiety 08/26/2011   HTN (hypertension) 08/26/2011     REFERRING PROVIDER: Edwin Cap, DPM  REFERRING DIAG: [phone] (ICD-10-CM) - Achilles tendinitis of left lower extremity  M62.462 (ICD-10-CM) - Gastrocnemius equinus of left lower extremity  THERAPY DIAG:  Pain in left ankle and joints of left foot  Stiffness of left ankle, not elsewhere classified  Difficulty in walking, not elsewhere classified  Rationale for Evaluation and Treatment: Rehabilitation  ONSET DATE: PT order 12/2 ; Pain prior to September  SUBJECTIVE:   SUBJECTIVE STATEMENT: Pt reports having pain for months with no specific MOI.  Pt's husband states the pain began for September.  Pt wore the boot for approx 1 month and felt better after  using the boot.  Pt has been wearing heel lifts/arch supports which help.  Pt uses voltaren and ice.     MD visit/PT order 12/2  Pt reports pain 1st thing in AM which improves as she moves.   Worst pain 1st thing in AM.  Pt has increased pain with stairs.  Pt has pain and limitations with ambulation.  Pt has increased pain with prolonged standing.  Pt has pain with certain shoes.  Pt uses a cane intermittently due to feeling unsteady.  Pt does have some pain with walking and standing performing volunteer work.  Pt's husband present during evaluation.   PERTINENT HISTORY: Haglund deformity, small posterior calcaneal enthesophyte per x ray findings HTN, osteopenia Pt unable to lie flat due to dizziness.  Her head needs to be propped.  PAIN:  NPRS:  0/10 current, 8/10 worst Worst pain 1st thing in AM Location: L achilles, primarily distal achilles  PRECAUTIONS: Other: osteopenia   WEIGHT BEARING RESTRICTIONS: No  FALLS:  Has patient fallen in last 6 months? No   OCCUPATION:  Retired.  Pt does volunteer work.   PLOF: Independent  PATIENT GOALS: to walk without limping, to return to PLOF.   OBJECTIVE:  Note: Objective measures were completed at Evaluation unless otherwise noted.  DIAGNOSTIC FINDINGS:  Left foot X-ray on 05/08/23: Haglund deformity, small posterior  calcaneal enthesophyte, mild pes cavus foot deformity with metatarsus adductus per MD note   PATIENT SURVEYS:  FOTO 43 with a goal of 64 at visit 14.  COGNITION: Overall cognitive status: Within functional limits for tasks assessed       PALPATION: TTP:  distal L achilles, medial distal achilles, medial calf  LOWER EXTREMITY ROM:  Active ROM Right eval Left eval  Hip flexion    Hip extension    Hip abduction    Hip adduction    Hip internal rotation    Hip external rotation    Knee flexion    Knee extension    Ankle dorsiflexion 10 -3/4  Ankle plantarflexion 69 65  Ankle inversion 32 30  Ankle  eversion 10 8   (Blank rows = not tested)  LOWER EXTREMITY MMT:  MMT Right eval Left eval  Hip flexion    Hip extension    Hip abduction    Hip adduction    Hip internal rotation    Hip external rotation    Knee flexion    Knee extension    Ankle dorsiflexion 5/5 5/5  Ankle plantarflexion WFL seated WFL seated   Ankle inversion 5/5 5/5  Ankle eversion 5/5 5/5   (Blank rows = not tested)   GAIT: Assistive device utilized: None Level of assistance: Complete Independence Comments: Antalgic gait.  Decreased toe off on L   TODAY'S TREATMENT:                                                                                                                                PT reviewed her home exercises from MD including calf stretching, SLS, Heel raises, lateral step up/heel tap, PF with T band.  Pt has been performing some of her HEP.   Pt performed standing gastroc stretch at wall 3x20 sec, Bilat heel raises with eccentric focus 2x10, PF with GTB with eccentric focus x 10 reps.  Pt received a HEP handout and was educated in correct form and appropriate frequency.     PATIENT EDUCATION:  Education details: dx, objective findings, relevant anatomy, HEP, POC, expectations next treatment/visit, and rationale of treatments.  PT answered pt's questions.   Person educated: Patient and Spouse Education method: Explanation, Demonstration, Tactile cues, Verbal cues, and Handouts Education comprehension: verbalized understanding, returned demonstration, verbal cues required, tactile cues required, and needs further education  HOME EXERCISE PROGRAM: Access Code: [email or web address] URL: [email or web address] Date: 07/07/2023 Prepared by: Aaron Edelman  Exercises - Gastroc Stretch on Wall  - 2 x daily - 7 x weekly - 2-3 reps - 20 seconds hold - Heel Raises with Counter Support  - 1 x daily - 5 x weekly - 2 sets - 10 reps  ASSESSMENT:  CLINICAL IMPRESSION: Patient is a 72 y.o. female  with dx's of L achilles tendinitis and gastrocnemius equinus presenting to the clinic with L ankle pain, limited DF AROM, and difficulty walking.  Pt  wore a boot for approx a month which helped.  Pt has been wearing heel lifts/arch supports which also help.  Pt has increased pain with her functional mobility skills including ambulation and stairs.  Pt is limited with ambulation due to pain.  Pt has increased pain with prolonged standing including with her volunteer work.  Pt uses a cane intermittently due to feeling unsteady.  Pt should benefit from skilled PT services to address impairments and improve overall function.    OBJECTIVE IMPAIRMENTS: Abnormal gait, decreased activity tolerance, decreased mobility, difficulty walking, decreased ROM, decreased strength, hypomobility, increased fascial restrictions, impaired flexibility, and pain.   ACTIVITY LIMITATIONS: standing, stairs, and locomotion level  PARTICIPATION LIMITATIONS: community activity and volunteer activities  PERSONAL FACTORS:   REHAB POTENTIAL: Good  CLINICAL DECISION MAKING: Stable/uncomplicated  EVALUATION COMPLEXITY: Low   GOALS:   SHORT TERM GOALS: Target date: 07/27/2023  Pt will be independent and compliant with HEP for improved pain, ROM, strength, and function.  Baseline: Goal status: INITIAL  2.  Pt will demo at least 2 deg of L ankle DF AROM for improved mobility, stiffness, and gait. Baseline:  Goal status: INITIAL  3.  Pt will report at least a 25% improvement in pain and sx's overall.  Baseline:  Goal status: INITIAL   LONG TERM GOALS: Target date: 08/17/2023  Pt will demo L ankle DF AROM to 10 deg for improved gait, stiffness, and mobility.  Baseline:  Goal status: INITIAL  2.  Pt will demo improved toe off with gait and ambulate without limping.  Baseline:  Goal status: INITIAL  3.  Pt will report improved steadiness with ambulation and not needing a cane with her normal ambulation.   Baseline:  Goal status: INITIAL  4.  Pt will ambulate extended community distance without significant pain and difficulty.   Baseline:  Goal status: INITIAL  5.  Pt will report she is able to perform her normal standing activities including volunteer work without significant pain and difficulty.  Baseline:  Goal status: INITIAL     PLAN:  PT FREQUENCY: 1-2x/week  PT DURATION: 6 weeks  PLANNED INTERVENTIONS: 97164- PT Re-evaluation, 97110-Therapeutic exercises, 97530- Therapeutic activity, [id number]- Neuromuscular re-education, 97535- Self Care, [id number]- Manual therapy, [id number]- Gait training, [id number]- Aquatic Therapy, 97014- Electrical stimulation (unattended), [id number]- Electrical stimulation (manual), [id number]- Ultrasound, Patient/Family education, Balance training, Stair training, Taping, Dry Needling, Cryotherapy, and Moist heat  PLAN FOR NEXT SESSION: review and perform HEP.  Cont with eccentric focus if pt tolerates it well.  Soft tissue work to calf and achilles including STM and IASTM.  Date of referral: 06/19/2023 Referring provider: Edwin Cap, DPM   Referring diagnosis? M76.62 (ICD-10-CM) - Achilles tendinitis of left lower extremity  M62.462 (ICD-10-CM) - Gastrocnemius equinus of left lower extremity Treatment diagnosis? (if different than referring diagnosis)   Pain in left ankle and joints of left foot  Stiffness of left ankle, not elsewhere classified  Difficulty in walking, not elsewhere classified  What was this (referring dx) caused by? No specific MOI  Ashby Dawes of Condition: Chronic (continuous duration > 3 months)   Laterality: Lt  Current Functional Measure Score: FOTO 43  Objective measurements identify impairments when they are compared to normal values, the uninvolved extremity, and prior level of function.  [x]  Yes  []  No  Objective assessment of functional ability: Moderate functional limitations   Briefly describe symptoms: pain in her  heel/achilles  How did symptoms start: Pt had no specific MOI  Average pain  intensity:  Last 24 hours: 5/10  Past week: 6/10  How often does the pt experience symptoms? Frequently  How much have the symptoms interfered with usual daily activities? Quite a bit  How has condition changed since care began at this facility? NA - initial visit  In general, how is the patients overall health? Gershon Mussel III PT, DPT 07/07/23 10:58 AM

## 2023-07-07 ENCOUNTER — Encounter (HOSPITAL_BASED_OUTPATIENT_CLINIC_OR_DEPARTMENT_OTHER): Payer: Self-pay | Admitting: Physical Therapy

## 2023-07-07 ENCOUNTER — Other Ambulatory Visit: Payer: Self-pay

## 2023-07-11 ENCOUNTER — Ambulatory Visit (HOSPITAL_BASED_OUTPATIENT_CLINIC_OR_DEPARTMENT_OTHER): Payer: Medicare Other | Admitting: Physical Therapy

## 2023-07-11 DIAGNOSIS — M25672 Stiffness of left ankle, not elsewhere classified: Secondary | ICD-10-CM

## 2023-07-11 DIAGNOSIS — M25572 Pain in left ankle and joints of left foot: Secondary | ICD-10-CM | POA: Diagnosis not present

## 2023-07-11 DIAGNOSIS — R262 Difficulty in walking, not elsewhere classified: Secondary | ICD-10-CM

## 2023-07-11 NOTE — Therapy (Signed)
OUTPATIENT PHYSICAL THERAPY TREATMENT   Patient Name: Amanda Swanson MRN: [id number] DOB:1951/03/16, 72 y.o., female Today's Date: 07/12/2023  END OF SESSION:  PT End of Session - 07/11/23 1406     Visit Number 2    Number of Visits 9    Date for PT Re-Evaluation 08/17/23    Authorization Type [employer] MCR    PT Start Time 1319    PT Stop Time 1354    PT Time Calculation (min) 35 min    Activity Tolerance Patient tolerated treatment well    Behavior During Therapy WFL for tasks assessed/performed              Past Medical History:  Diagnosis Date   Allergy    Anxiety    Cataract    Heart murmur    Hypertension    IBS (irritable bowel syndrome)    Osteopenia 10/2017   T score -2.0 FRAX 3.6% / 0.3%   Urticaria    Past Surgical History:  Procedure Laterality Date   BREAST SURGERY     Patient Active Problem List   Diagnosis Date Noted   Chronic rhinitis 08/25/2020   Pruritus 08/25/2020   Shortness of breath 08/25/2020   Adverse reaction to food, subsequent encounter 08/24/2020   Allergic rhinitis 04/14/2015   Food allergy 04/14/2015   Oral allergy syndrome 04/14/2015   Palpitations 04/03/2013   hyperlipidemia 02/10/2012   Anxiety 08/26/2011   HTN (hypertension) 08/26/2011     REFERRING PROVIDER: Edwin Cap, DPM  REFERRING DIAG: [phone] (ICD-10-CM) - Achilles tendinitis of left lower extremity  M62.462 (ICD-10-CM) - Gastrocnemius equinus of left lower extremity  THERAPY DIAG:  Pain in left ankle and joints of left foot  Stiffness of left ankle, not elsewhere classified  Difficulty in walking, not elsewhere classified  Rationale for Evaluation and Treatment: Rehabilitation  ONSET DATE: PT order 12/2 ; Pain prior to September  SUBJECTIVE:   SUBJECTIVE STATEMENT: Pt reports she may have done too many heel raises.  Pt was performing standing heel raises, but also performing heel raises on machine without the platform moving at the gym.  Pt  reports having pain in bilat great toes.  She stopped performing the heel raises and hasn't done them for about 4-5 days.  She has been performing the wall stretch.  She has been performing the stationary bike without increased sx's.  Pt has pain 1st thing in AM which is worse some days and better some days.  Pt has increased pain with stairs.  Pt has pain and limitations with ambulation.  Pt does have some pain with walking and standing performing volunteer work.    Pt states tomorrow is the last day of antibiotics that she is on due to having continued tooth pain after a tooth extraction   PERTINENT HISTORY: Haglund deformity, small posterior calcaneal enthesophyte per x ray findings HTN, osteopenia Pt unable to lie flat due to dizziness.  Her head needs to be propped.  PAIN:  NPRS:  0/10 current, 8/10 worst Worst pain 1st thing in AM Location: L achilles, primarily distal achilles  PRECAUTIONS: Other: osteopenia   WEIGHT BEARING RESTRICTIONS: No  FALLS:  Has patient fallen in last 6 months? No   OCCUPATION:  Retired.  Pt does volunteer work.   PLOF: Independent  PATIENT GOALS: to walk without limping, to return to PLOF.   OBJECTIVE:  Note: Objective measures were completed at Evaluation unless otherwise noted.  DIAGNOSTIC FINDINGS:  Left foot X-ray on  05/08/23: Haglund deformity, small posterior calcaneal enthesophyte, mild pes cavus foot deformity with metatarsus adductus per MD note     TODAY'S TREATMENT:                                                                                                                                Manual Therapy:   STM to L calf and IASTM to L achilles in prone to improve soft tissue tightness and mobility, reduce myofascial adhesions and restrictions, and to promote proper cross fiber alignment.    Reviewed HEP.  Pt performed standing gastroc stretch at wall 3x20 sec, Bilat heel raises with eccentric focus 2x10, PF with GTB with  eccentric focus x 10 reps.  PT educated pt in correct form.     PATIENT EDUCATION:  Education details: dx, relevant anatomy, HEP, POC, IASTM response, and rationale of treatments.  PT answered pt's questions.   Person educated: Patient and Spouse Education method: Explanation, Demonstration, Tactile cues, Verbal cues, and Handouts Education comprehension: verbalized understanding, returned demonstration, verbal cues required, tactile cues required, and needs further education  HOME EXERCISE PROGRAM: Access Code: [email or web address] URL: [email or web address] Date: 07/07/2023 Prepared by: Aaron Edelman  Exercises - Gastroc Stretch on Wall  - 2 x daily - 7 x weekly - 2-3 reps - 20 seconds hold - Heel Raises with Counter Support  - 1 x daily - 5 x weekly - 2 sets - 10 reps  ASSESSMENT:  CLINICAL IMPRESSION: PT performed STM to calf and IASTM to achilles tendon and pt had an appropriate response.  Pt reports reduced tightness after MT.  PT reviewed HEP.  Pt requires cuing and instruction in correct form with home exercises including to not lean forward with heel raises and for positioning with standing calf stretch.  PT educated pt in correct form with eccentric PF with theraband and instructed pt in performing that exercise at home if she has increased pain with eccentric heel raises.  Pt responded well to Rx reporting improved tightness and having no increased pain after Rx.   Pt should benefit from skilled PT services to address impairments and goals and improve overall function.  OBJECTIVE IMPAIRMENTS: Abnormal gait, decreased activity tolerance, decreased mobility, difficulty walking, decreased ROM, decreased strength, hypomobility, increased fascial restrictions, impaired flexibility, and pain.   ACTIVITY LIMITATIONS: standing, stairs, and locomotion level  PARTICIPATION LIMITATIONS: community activity and volunteer activities  PERSONAL FACTORS:   REHAB POTENTIAL: Good  CLINICAL  DECISION MAKING: Stable/uncomplicated  EVALUATION COMPLEXITY: Low   GOALS:   SHORT TERM GOALS: Target date: 07/27/2023  Pt will be independent and compliant with HEP for improved pain, ROM, strength, and function.  Baseline: Goal status: INITIAL  2.  Pt will demo at least 2 deg of L ankle DF AROM for improved mobility, stiffness, and gait. Baseline:  Goal status: INITIAL  3.  Pt will report at least a 25% improvement in pain and sx's overall.  Baseline:  Goal status: INITIAL   LONG TERM GOALS: Target date: 08/17/2023  Pt will demo L ankle DF AROM to 10 deg for improved gait, stiffness, and mobility.  Baseline:  Goal status: INITIAL  2.  Pt will demo improved toe off with gait and ambulate without limping.  Baseline:  Goal status: INITIAL  3.  Pt will report improved steadiness with ambulation and not needing a cane with her normal ambulation.  Baseline:  Goal status: INITIAL  4.  Pt will ambulate extended community distance without significant pain and difficulty.   Baseline:  Goal status: INITIAL  5.  Pt will report she is able to perform her normal standing activities including volunteer work without significant pain and difficulty.  Baseline:  Goal status: INITIAL     PLAN:  PT FREQUENCY: 1-2x/week  PT DURATION: 6 weeks  PLANNED INTERVENTIONS: 97164- PT Re-evaluation, 97110-Therapeutic exercises, 97530- Therapeutic activity, [id number]- Neuromuscular re-education, 97535- Self Care, 16109- Manual therapy, [id number]- Gait training, [id number]- Aquatic Therapy, 97014- Electrical stimulation (unattended), [id number]- Electrical stimulation (manual), [id number]- Ultrasound, Patient/Family education, Balance training, Stair training, Taping, Dry Needling, Cryotherapy, and Moist heat  PLAN FOR NEXT SESSION: review and perform HEP.  Cont with eccentric focus if pt tolerates it well.  Soft tissue work to calf and achilles including STM and IASTM.   Audie Clear III PT, DPT 07/12/23 12:55  AM

## 2023-07-12 ENCOUNTER — Encounter (HOSPITAL_BASED_OUTPATIENT_CLINIC_OR_DEPARTMENT_OTHER): Payer: Self-pay | Admitting: Physical Therapy

## 2023-07-28 ENCOUNTER — Ambulatory Visit (HOSPITAL_BASED_OUTPATIENT_CLINIC_OR_DEPARTMENT_OTHER): Payer: Medicare Other | Attending: Podiatry | Admitting: Physical Therapy

## 2023-07-28 ENCOUNTER — Encounter (HOSPITAL_BASED_OUTPATIENT_CLINIC_OR_DEPARTMENT_OTHER): Payer: Self-pay | Admitting: Physical Therapy

## 2023-07-28 DIAGNOSIS — R262 Difficulty in walking, not elsewhere classified: Secondary | ICD-10-CM | POA: Diagnosis present

## 2023-07-28 DIAGNOSIS — M25572 Pain in left ankle and joints of left foot: Secondary | ICD-10-CM | POA: Insufficient documentation

## 2023-07-28 DIAGNOSIS — M25672 Stiffness of left ankle, not elsewhere classified: Secondary | ICD-10-CM | POA: Insufficient documentation

## 2023-07-28 NOTE — Therapy (Signed)
 OUTPATIENT PHYSICAL THERAPY TREATMENT   Patient Name: Amanda Swanson MRN: [id number] DOB:15-Jan-1951, 73 y.o., female Today's Date: 07/28/2023  END OF SESSION:  PT End of Session - 07/28/23 1056     Visit Number 3    Number of Visits 9    Date for PT Re-Evaluation 08/17/23    Authorization Type [employer] MCR    PT Start Time 1027    PT Stop Time 1112    PT Time Calculation (min) 45 min    Activity Tolerance Patient tolerated treatment well    Behavior During Therapy WFL for tasks assessed/performed               Past Medical History:  Diagnosis Date   Allergy    Anxiety    Cataract    Heart murmur    Hypertension    IBS (irritable bowel syndrome)    Osteopenia 10/2017   T score -2.0 FRAX 3.6% / 0.3%   Urticaria    Past Surgical History:  Procedure Laterality Date   BREAST SURGERY     Patient Active Problem List   Diagnosis Date Noted   Chronic rhinitis 08/25/2020   Pruritus 08/25/2020   Shortness of breath 08/25/2020   Adverse reaction to food, subsequent encounter 08/24/2020   Allergic rhinitis 04/14/2015   Food allergy 04/14/2015   Oral allergy syndrome 04/14/2015   Palpitations 04/03/2013   hyperlipidemia 02/10/2012   Anxiety 08/26/2011   HTN (hypertension) 08/26/2011     REFERRING PROVIDER: Silva Juliene SAUNDERS, DPM  REFERRING DIAG: [phone] (ICD-10-CM) - Achilles tendinitis of left lower extremity  M62.462 (ICD-10-CM) - Gastrocnemius equinus of left lower extremity  THERAPY DIAG:  Pain in left ankle and joints of left foot  Stiffness of left ankle, not elsewhere classified  Difficulty in walking, not elsewhere classified  Rationale for Evaluation and Treatment: Rehabilitation  ONSET DATE: PT order 12/2 ; Pain prior to September  SUBJECTIVE:   SUBJECTIVE STATEMENT: Sometimes it feels better, sometimes not.  Pain comes and goes and doesn't last long.  Pt used some inserts which helped some though began feeling sore after wearing it for  hours.  Pt wears the inserts some though it doesn't fit in all of her shoes.  Pt states she thinks the weather affects it.  It has been cold and she thinks the cold weather makes it hurt worse.  Pt denies any adverse effects after prior Rx.  Pt states it didn't bother her as much after prior Rx.   Pt thinks the manual therapy last Rx helped.  Pt reports she has performed her HEP some.  Pt states she is walking better.   PERTINENT HISTORY: Haglund deformity, small posterior calcaneal enthesophyte per x ray findings HTN, osteopenia Pt unable to lie flat due to dizziness.  Her head needs to be propped.  PAIN:  NPRS:  0/10 current, 7/10 worst Worst pain 1st thing in AM Location: L achilles, primarily distal achilles  PRECAUTIONS: Other: osteopenia   WEIGHT BEARING RESTRICTIONS: No  FALLS:  Has patient fallen in last 6 months? No   OCCUPATION:  Retired.  Pt does volunteer work.   PLOF: Independent  PATIENT GOALS: to walk without limping, to return to PLOF.   OBJECTIVE:  Note: Objective measures were completed at Evaluation unless otherwise noted.  DIAGNOSTIC FINDINGS:  Left foot X-ray on 05/08/23: Haglund deformity, small posterior calcaneal enthesophyte, mild pes cavus foot deformity with metatarsus adductus per MD note     TODAY'S TREATMENT:  Manual Therapy:   STM to L calf and IASTM to L achilles in prone to improve soft tissue tightness and mobility, reduce myofascial adhesions and restrictions, and to promote proper cross fiber alignment.    Reviewed HEP.  Pt performed: Bilat heel raises with eccentric focus 2x10, 1x5 with bilat concentric and SL eccentric SLS 3x20 sec with fingertip support Marching on airex 2x10 Step up on airex 2x10 Standing gastroc stretch 2x30 sec Longsitting gastroc stretch x 30 sec   PATIENT EDUCATION:  Education  details: dx, relevant anatomy, HEP, POC, IASTM response, and rationale of treatments.  PT answered pt's questions.   Person educated: Patient and Spouse Education method: Explanation, Demonstration, Tactile cues, Verbal cues, and Handouts Education comprehension: verbalized understanding, returned demonstration, verbal cues required, tactile cues required, and needs further education  HOME EXERCISE PROGRAM: Access Code: [email or web address] URL: [email or web address] Date: 07/07/2023 Prepared by: Mose Minerva  Exercises - Gastroc Stretch on Wall  - 2 x daily - 7 x weekly - 2-3 reps - 20 seconds hold - Heel Raises with Counter Support  - 1 x daily - 5 x weekly - 2 sets - 10 reps  ASSESSMENT:  CLINICAL IMPRESSION: Pt reports her pain comes and goes.  She states she is walking better.  Pt has been using inserts some.  She thinks the inserts help though has soreness with prolonged usage.  Pt reports she has performed her HEP some.  PT encouraged pt to be compliant with HEP.  PT performed STM to calf and IASTM to achilles tendon and pt had an appropriate response.  PT had pt attempt bilat concentric and SL eccentric heel raises and pt stopped after 5 reps due to pain.  Pt able to perform bilat conc and bilat ecc heel raises without pain.  Pt requires cuing for correct form with gastroc stretches including positioning and to lean toward the wall and not to push against the wall.  Pt responded well to Rx reporting no pain after Rx, just soreness.   OBJECTIVE IMPAIRMENTS: Abnormal gait, decreased activity tolerance, decreased mobility, difficulty walking, decreased ROM, decreased strength, hypomobility, increased fascial restrictions, impaired flexibility, and pain.   ACTIVITY LIMITATIONS: standing, stairs, and locomotion level  PARTICIPATION LIMITATIONS: community activity and volunteer activities  PERSONAL FACTORS:   REHAB POTENTIAL: Good  CLINICAL DECISION MAKING:  Stable/uncomplicated  EVALUATION COMPLEXITY: Low   GOALS:   SHORT TERM GOALS: Target date: 07/27/2023  Pt will be independent and compliant with HEP for improved pain, ROM, strength, and function.  Baseline: Goal status: INITIAL  2.  Pt will demo at least 2 deg of L ankle DF AROM for improved mobility, stiffness, and gait. Baseline:  Goal status: INITIAL  3.  Pt will report at least a 25% improvement in pain and sx's overall.  Baseline:  Goal status: INITIAL   LONG TERM GOALS: Target date: 08/17/2023  Pt will demo L ankle DF AROM to 10 deg for improved gait, stiffness, and mobility.  Baseline:  Goal status: INITIAL  2.  Pt will demo improved toe off with gait and ambulate without limping.  Baseline:  Goal status: INITIAL  3.  Pt will report improved steadiness with ambulation and not needing a cane with her normal ambulation.  Baseline:  Goal status: INITIAL  4.  Pt will ambulate extended community distance without significant pain and difficulty.   Baseline:  Goal status: INITIAL  5.  Pt will report she is able to perform her normal standing activities including volunteer work  without significant pain and difficulty.  Baseline:  Goal status: INITIAL     PLAN:  PT FREQUENCY: 1-2x/week  PT DURATION: 6 weeks  PLANNED INTERVENTIONS: 97164- PT Re-evaluation, 97110-Therapeutic exercises, 97530- Therapeutic activity, [id number]- Neuromuscular re-education, 97535- Self Care, [id number]- Manual therapy, [id number]- Gait training, [phone]- Aquatic Therapy, 97014- Electrical stimulation (unattended), [id number]- Electrical stimulation (manual), [id number]- Ultrasound, Patient/Family education, Balance training, Stair training, Taping, Dry Needling, Cryotherapy, and Moist heat  PLAN FOR NEXT SESSION:  Cont with eccentric training, calf stretching, and proprioception.  Soft tissue work to calf and achilles including STM and IASTM.   Leigh Minerva III PT, DPT 07/28/23 11:27 AM

## 2023-08-02 ENCOUNTER — Ambulatory Visit (HOSPITAL_BASED_OUTPATIENT_CLINIC_OR_DEPARTMENT_OTHER): Payer: Medicare Other | Admitting: Physical Therapy

## 2023-08-02 DIAGNOSIS — M25572 Pain in left ankle and joints of left foot: Secondary | ICD-10-CM

## 2023-08-02 DIAGNOSIS — R262 Difficulty in walking, not elsewhere classified: Secondary | ICD-10-CM

## 2023-08-02 DIAGNOSIS — M25672 Stiffness of left ankle, not elsewhere classified: Secondary | ICD-10-CM

## 2023-08-02 NOTE — Therapy (Addendum)
 OUTPATIENT PHYSICAL THERAPY TREATMENT   Patient Name: Amanda Swanson MRN: [id number] DOB:Aug 03, 1950, 73 y.o., female Today's Date: 08/03/2023  END OF SESSION:  PT End of Session - 08/02/23 1418     Visit Number 4    Number of Visits 9    Date for PT Re-Evaluation 08/17/23    Authorization Type [employer] MCR    PT Start Time 1409    PT Stop Time 1450    PT Time Calculation (min) 41 min    Activity Tolerance Patient tolerated treatment well    Behavior During Therapy WFL for tasks assessed/performed                Past Medical History:  Diagnosis Date   Allergy    Anxiety    Cataract    Heart murmur    Hypertension    IBS (irritable bowel syndrome)    Osteopenia 10/2017   T score -2.0 FRAX 3.6% / 0.3%   Urticaria    Past Surgical History:  Procedure Laterality Date   BREAST SURGERY     Patient Active Problem List   Diagnosis Date Noted   Chronic rhinitis 08/25/2020   Pruritus 08/25/2020   Shortness of breath 08/25/2020   Adverse reaction to food, subsequent encounter 08/24/2020   Allergic rhinitis 04/14/2015   Food allergy 04/14/2015   Oral allergy syndrome 04/14/2015   Palpitations 04/03/2013   hyperlipidemia 02/10/2012   Anxiety 08/26/2011   HTN (hypertension) 08/26/2011     REFERRING PROVIDER: Silva Juliene SAUNDERS, DPM  REFERRING DIAG: [phone] (ICD-10-CM) - Achilles tendinitis of left lower extremity  M62.462 (ICD-10-CM) - Gastrocnemius equinus of left lower extremity  THERAPY DIAG:  Pain in left ankle and joints of left foot  Stiffness of left ankle, not elsewhere classified  Difficulty in walking, not elsewhere classified  Rationale for Evaluation and Treatment: Rehabilitation  ONSET DATE: PT order 12/2 ; Pain prior to September  SUBJECTIVE:   SUBJECTIVE STATEMENT: Pt states her heel has been hurting, not sure if it's due to the cold weather.  Pt states she felt fine after prior Rx though went to the gym afterwards.  She states she overdid  it at the gym.  Pt rested after the gym and elevated LE.  Pt states her pain comes and goes.  Pt has been performing her home exercises.  Pt wears the inserts in her tennis shoes and boots and states they help.  Pt states she is not walking as good as she was and thinks it may be due to the cold weather.    PERTINENT HISTORY: Haglund deformity, small posterior calcaneal enthesophyte per x ray findings HTN, osteopenia Pt unable to lie flat due to dizziness.  Her head needs to be propped.  PAIN:  NPRS:  0/10 current, 7/10 worst, 5/10 with ambulation Worst pain 1st thing in AM Location: L achilles, primarily distal achilles  PRECAUTIONS: Other: osteopenia   WEIGHT BEARING RESTRICTIONS: No  FALLS:  Has patient fallen in last 6 months? No   OCCUPATION:  Retired.  Pt does volunteer work.   PLOF: Independent  PATIENT GOALS: to walk without limping, to return to PLOF.   OBJECTIVE:  Note: Objective measures were completed at Evaluation unless otherwise noted.  DIAGNOSTIC FINDINGS:  Left foot X-ray on 05/08/23: Haglund deformity, small posterior calcaneal enthesophyte, mild pes cavus foot deformity with metatarsus adductus per MD note     TODAY'S TREATMENT:  Manual Therapy:   STM to L calf and IASTM to L achilles in prone to improve soft tissue tightness and mobility, reduce myofascial adhesions and restrictions, and to promote proper cross fiber alignment.    Reviewed HEP.  Pt performed: Bilat heel raises with eccentric focus 2x10, attempted bilat concentric and SL eccentric though stopped due to pain SLS 3x20 sec with fingertip support Marching on airex 2x10-15 Step up on airex 2x10-12 PF with eccentric focus with GTB 2x10 Standing gastroc stretch 3x20 sec   PATIENT EDUCATION:  Education details: dx, relevant anatomy, HEP, POC, IASTM response, and  rationale of treatments.  PT answered pt's questions.   Person educated: Patient and Spouse Education method: Explanation, Demonstration, Tactile cues, Verbal cues Education comprehension: verbalized understanding, returned demonstration, verbal cues required, tactile cues required, and needs further education  HOME EXERCISE PROGRAM: Access Code: [email or web address] URL: [email or web address] Date: 07/07/2023 Prepared by: Mose Minerva  Exercises - Gastroc Stretch on Wall  - 2 x daily - 7 x weekly - 2-3 reps - 20 seconds hold - Heel Raises with Counter Support  - 1 x daily - 5 x weekly - 2 sets - 10 reps  ASSESSMENT:  CLINICAL IMPRESSION: Pt reports her pain comes and goes.  Pt states she is not walking as good as she was and thinks it may be due to the cold weather.  Pt wears the inserts which helps.  Pt has been performing her HEP.  PT performed STM to calf and IASTM to achilles tendon and pt had an appropriate response.  Pt is still unable to perform bilat concentric and SL eccentric heel raises due to pain.  Pt able to perform bilat conc and bilat ecc heel raises without pain.  Pt becomes fatigued in UE's with standing wall gastroc stretch.  PT worked on form and instructed pt to lean toward the wall and not to push against the wall.  PT also instructed pt in correct positioning with wall gastroc stretch.  Pt responded well to Rx reporting a little soreness after Rx.  OBJECTIVE IMPAIRMENTS: Abnormal gait, decreased activity tolerance, decreased mobility, difficulty walking, decreased ROM, decreased strength, hypomobility, increased fascial restrictions, impaired flexibility, and pain.   ACTIVITY LIMITATIONS: standing, stairs, and locomotion level  PARTICIPATION LIMITATIONS: community activity and volunteer activities  PERSONAL FACTORS:   REHAB POTENTIAL: Good  CLINICAL DECISION MAKING: Stable/uncomplicated  EVALUATION COMPLEXITY: Low   GOALS:   SHORT TERM GOALS: Target  date: 07/27/2023  Pt will be independent and compliant with HEP for improved pain, ROM, strength, and function.  Baseline: Goal status: INITIAL  2.  Pt will demo at least 2 deg of L ankle DF AROM for improved mobility, stiffness, and gait. Baseline:  Goal status: INITIAL  3.  Pt will report at least a 25% improvement in pain and sx's overall.  Baseline:  Goal status: INITIAL   LONG TERM GOALS: Target date: 08/17/2023  Pt will demo L ankle DF AROM to 10 deg for improved gait, stiffness, and mobility.  Baseline:  Goal status: INITIAL  2.  Pt will demo improved toe off with gait and ambulate without limping.  Baseline:  Goal status: INITIAL  3.  Pt will report improved steadiness with ambulation and not needing a cane with her normal ambulation.  Baseline:  Goal status: INITIAL  4.  Pt will ambulate extended community distance without significant pain and difficulty.   Baseline:  Goal status: INITIAL  5.  Pt will report she is able to perform  her normal standing activities including volunteer work without significant pain and difficulty.  Baseline:  Goal status: INITIAL     PLAN:  PT FREQUENCY: 1-2x/week  PT DURATION: 6 weeks  PLANNED INTERVENTIONS: 97164- PT Re-evaluation, 97110-Therapeutic exercises, 97530- Therapeutic activity, [id number]- Neuromuscular re-education, 97535- Self Care, 02859- Manual therapy, [id number]- Gait training, [phone]- Aquatic Therapy, 97014- Electrical stimulation (unattended), [id number]- Electrical stimulation (manual), [id number]- Ultrasound, Patient/Family education, Balance training, Stair training, Taping, Dry Needling, Cryotherapy, and Moist heat  PLAN FOR NEXT SESSION:  Cont with eccentric training, calf stretching, and proprioception.  Soft tissue work to calf and achilles including STM and IASTM.   Leigh Minerva III PT, DPT 08/03/23 10:59 AM  PHYSICAL THERAPY DISCHARGE SUMMARY  Visits from Start of Care: 4  Current functional level related to goals  / functional outcomes: Unable to assess due to pt not being present at discharge.    Remaining deficits: Unable to assess due to pt not being present at discharge.    Education / Equipment: HEP   Patient was seen in PT from 07/06/23 to 08/02/23.  Pt cancelled her last PT appointment due to Covid exposure.  PT did not hear back from pt and she will be considered discharged at this time.   Leigh Minerva III PT, DPT 05/20/24 8:36 AM

## 2023-08-03 ENCOUNTER — Encounter (HOSPITAL_BASED_OUTPATIENT_CLINIC_OR_DEPARTMENT_OTHER): Payer: Self-pay | Admitting: Physical Therapy

## 2023-08-10 ENCOUNTER — Ambulatory Visit (HOSPITAL_BASED_OUTPATIENT_CLINIC_OR_DEPARTMENT_OTHER): Payer: Medicare Other | Admitting: Physical Therapy

## 2023-08-16 ENCOUNTER — Ambulatory Visit (HOSPITAL_BASED_OUTPATIENT_CLINIC_OR_DEPARTMENT_OTHER): Payer: Medicare Other | Admitting: Physical Therapy

## 2023-08-22 ENCOUNTER — Ambulatory Visit: Payer: Medicare Other | Admitting: Podiatry

## 2023-08-23 ENCOUNTER — Encounter (HOSPITAL_BASED_OUTPATIENT_CLINIC_OR_DEPARTMENT_OTHER): Payer: Medicare Other | Admitting: Physical Therapy

## 2023-11-09 ENCOUNTER — Other Ambulatory Visit (HOSPITAL_COMMUNITY): Payer: Self-pay

## 2023-11-09 ENCOUNTER — Other Ambulatory Visit: Payer: Self-pay

## 2024-02-21 ENCOUNTER — Other Ambulatory Visit (HOSPITAL_COMMUNITY): Payer: Self-pay

## 2024-02-21 MED ORDER — NEBIVOLOL HCL 10 MG PO TABS
10.0000 mg | ORAL_TABLET | Freq: Every day | ORAL | 4 refills | Status: AC
  Filled 2024-02-21: qty 30, 30d supply, fill #0
  Filled 2024-03-22: qty 30, 30d supply, fill #1
  Filled 2024-04-19: qty 30, 30d supply, fill #2
  Filled 2024-05-19: qty 30, 30d supply, fill #3
  Filled 2024-06-19: qty 30, 30d supply, fill #4

## 2024-02-22 ENCOUNTER — Other Ambulatory Visit (HOSPITAL_COMMUNITY): Payer: Self-pay

## 2024-02-22 MED ORDER — NEBIVOLOL HCL 10 MG PO TABS: 10.0000 mg | ORAL_TABLET | Freq: Every day | ORAL | 4 refills | Status: AC

## 2024-02-23 ENCOUNTER — Other Ambulatory Visit (HOSPITAL_COMMUNITY): Payer: Self-pay

## 2024-03-22 ENCOUNTER — Other Ambulatory Visit (HOSPITAL_COMMUNITY): Payer: Self-pay

## 2024-04-19 ENCOUNTER — Other Ambulatory Visit (HOSPITAL_COMMUNITY): Payer: Self-pay

## 2024-05-20 ENCOUNTER — Other Ambulatory Visit: Payer: Self-pay

## 2024-05-20 ENCOUNTER — Other Ambulatory Visit (HOSPITAL_COMMUNITY): Payer: Self-pay

## 2024-05-21 ENCOUNTER — Other Ambulatory Visit (HOSPITAL_COMMUNITY): Payer: Self-pay

## 2024-06-19 ENCOUNTER — Other Ambulatory Visit (HOSPITAL_COMMUNITY): Payer: Self-pay

## 2024-08-02 ENCOUNTER — Other Ambulatory Visit (HOSPITAL_COMMUNITY): Payer: Self-pay

## 2024-08-02 MED ORDER — BYSTOLIC 10 MG PO TABS
10.0000 mg | ORAL_TABLET | Freq: Every day | ORAL | 4 refills | Status: AC
  Filled 2024-08-02: qty 90, 90d supply, fill #0
  Filled 2024-08-02: qty 30, 30d supply, fill #0

## 2024-08-05 ENCOUNTER — Other Ambulatory Visit (HOSPITAL_COMMUNITY): Payer: Self-pay

## 2024-08-05 MED ORDER — BYSTOLIC 10 MG PO TABS
10.0000 mg | ORAL_TABLET | Freq: Every day | ORAL | 11 refills | Status: AC
  Filled 2024-08-05: qty 30, 30d supply, fill #0
